# Patient Record
Sex: Female | Born: 1965 | Race: Black or African American | Hispanic: No | Smoking: Never smoker
Health system: Southern US, Community
[De-identification: ages and names within clinical notes are randomized; demographics above are authoritative.]

## PROBLEM LIST (undated history)

## (undated) DIAGNOSIS — I10 Essential (primary) hypertension: Secondary | ICD-10-CM

## (undated) DIAGNOSIS — R768 Other specified abnormal immunological findings in serum: Secondary | ICD-10-CM

## (undated) DIAGNOSIS — K759 Inflammatory liver disease, unspecified: Secondary | ICD-10-CM

## (undated) HISTORY — PX: TUBAL LIGATION: SHX77

## (undated) HISTORY — DX: Inflammatory liver disease, unspecified: K75.9

## (undated) HISTORY — DX: Other specified abnormal immunological findings in serum: R76.8

## (undated) HISTORY — DX: Essential (primary) hypertension: I10

---

## 1997-10-22 ENCOUNTER — Emergency Department (HOSPITAL_COMMUNITY): Admission: EM | Admit: 1997-10-22 | Discharge: 1997-10-22 | Payer: Self-pay | Admitting: Emergency Medicine

## 2003-01-24 ENCOUNTER — Encounter: Payer: Self-pay | Admitting: Obstetrics and Gynecology

## 2003-01-24 ENCOUNTER — Ambulatory Visit (HOSPITAL_COMMUNITY): Admission: RE | Admit: 2003-01-24 | Discharge: 2003-01-24 | Payer: Self-pay | Admitting: Obstetrics and Gynecology

## 2003-04-23 DIAGNOSIS — K759 Inflammatory liver disease, unspecified: Secondary | ICD-10-CM

## 2003-04-23 HISTORY — DX: Inflammatory liver disease, unspecified: K75.9

## 2004-02-09 ENCOUNTER — Ambulatory Visit (HOSPITAL_COMMUNITY): Admission: RE | Admit: 2004-02-09 | Discharge: 2004-02-09 | Payer: Self-pay

## 2004-03-12 ENCOUNTER — Emergency Department (HOSPITAL_COMMUNITY): Admission: EM | Admit: 2004-03-12 | Discharge: 2004-03-12 | Payer: Self-pay | Admitting: Emergency Medicine

## 2004-05-24 ENCOUNTER — Ambulatory Visit: Payer: Self-pay | Admitting: Internal Medicine

## 2004-09-26 ENCOUNTER — Ambulatory Visit: Payer: Self-pay | Admitting: Family Medicine

## 2004-10-01 ENCOUNTER — Encounter: Admission: RE | Admit: 2004-10-01 | Discharge: 2004-10-01 | Payer: Self-pay | Admitting: Family Medicine

## 2004-10-29 ENCOUNTER — Ambulatory Visit: Payer: Self-pay | Admitting: Family Medicine

## 2005-10-14 ENCOUNTER — Ambulatory Visit: Payer: Self-pay | Admitting: Internal Medicine

## 2005-10-14 ENCOUNTER — Other Ambulatory Visit: Admission: RE | Admit: 2005-10-14 | Discharge: 2005-10-14 | Payer: Self-pay | Admitting: Internal Medicine

## 2006-10-03 ENCOUNTER — Inpatient Hospital Stay (HOSPITAL_COMMUNITY): Admission: EM | Admit: 2006-10-03 | Discharge: 2006-10-04 | Payer: Self-pay | Admitting: Emergency Medicine

## 2006-10-07 ENCOUNTER — Encounter (HOSPITAL_COMMUNITY): Admission: RE | Admit: 2006-10-07 | Discharge: 2006-11-06 | Payer: Self-pay | Admitting: General Surgery

## 2008-03-02 ENCOUNTER — Emergency Department (HOSPITAL_COMMUNITY): Admission: EM | Admit: 2008-03-02 | Discharge: 2008-03-02 | Payer: Self-pay | Admitting: Emergency Medicine

## 2009-03-13 ENCOUNTER — Other Ambulatory Visit: Admission: RE | Admit: 2009-03-13 | Discharge: 2009-03-13 | Payer: Self-pay | Admitting: Obstetrics and Gynecology

## 2009-03-17 ENCOUNTER — Ambulatory Visit (HOSPITAL_COMMUNITY): Admission: RE | Admit: 2009-03-17 | Discharge: 2009-03-17 | Payer: Self-pay | Admitting: Obstetrics & Gynecology

## 2009-05-24 ENCOUNTER — Emergency Department (HOSPITAL_COMMUNITY): Admission: EM | Admit: 2009-05-24 | Discharge: 2009-05-25 | Payer: Self-pay | Admitting: Emergency Medicine

## 2009-06-11 ENCOUNTER — Emergency Department (HOSPITAL_COMMUNITY): Admission: EM | Admit: 2009-06-11 | Discharge: 2009-06-11 | Payer: Self-pay | Admitting: Emergency Medicine

## 2009-06-15 ENCOUNTER — Emergency Department (HOSPITAL_COMMUNITY): Admission: EM | Admit: 2009-06-15 | Discharge: 2009-06-16 | Payer: Self-pay | Admitting: Emergency Medicine

## 2009-06-16 ENCOUNTER — Ambulatory Visit (HOSPITAL_COMMUNITY): Admission: RE | Admit: 2009-06-16 | Discharge: 2009-06-16 | Payer: Self-pay | Admitting: Emergency Medicine

## 2009-06-21 ENCOUNTER — Ambulatory Visit (HOSPITAL_COMMUNITY): Admission: RE | Admit: 2009-06-21 | Discharge: 2009-06-21 | Payer: Self-pay | Admitting: Obstetrics & Gynecology

## 2009-06-28 DIAGNOSIS — L299 Pruritus, unspecified: Secondary | ICD-10-CM | POA: Insufficient documentation

## 2009-06-28 DIAGNOSIS — Z9189 Other specified personal risk factors, not elsewhere classified: Secondary | ICD-10-CM | POA: Insufficient documentation

## 2009-06-28 DIAGNOSIS — Z8619 Personal history of other infectious and parasitic diseases: Secondary | ICD-10-CM | POA: Insufficient documentation

## 2009-06-28 DIAGNOSIS — R5383 Other fatigue: Secondary | ICD-10-CM

## 2009-06-28 DIAGNOSIS — R5381 Other malaise: Secondary | ICD-10-CM | POA: Insufficient documentation

## 2009-06-29 ENCOUNTER — Ambulatory Visit: Payer: Self-pay | Admitting: Internal Medicine

## 2009-06-29 DIAGNOSIS — R1084 Generalized abdominal pain: Secondary | ICD-10-CM | POA: Insufficient documentation

## 2009-06-29 DIAGNOSIS — R7401 Elevation of levels of liver transaminase levels: Secondary | ICD-10-CM | POA: Insufficient documentation

## 2009-06-29 DIAGNOSIS — B182 Chronic viral hepatitis C: Secondary | ICD-10-CM

## 2009-06-29 DIAGNOSIS — R740 Nonspecific elevation of levels of transaminase and lactic acid dehydrogenase [LDH]: Secondary | ICD-10-CM

## 2009-06-30 ENCOUNTER — Encounter: Payer: Self-pay | Admitting: Gastroenterology

## 2009-06-30 ENCOUNTER — Encounter: Payer: Self-pay | Admitting: Internal Medicine

## 2009-07-07 LAB — CONVERTED CEMR LAB
ANA Titer 1: 1:40 {titer} — ABNORMAL HIGH
HCV Quantitative: 315000 intl units/mL — ABNORMAL HIGH (ref <43)
Prothrombin Time: 15.8 s — ABNORMAL HIGH (ref 11.6–15.2)

## 2009-07-13 ENCOUNTER — Encounter: Payer: Self-pay | Admitting: Internal Medicine

## 2009-07-13 LAB — CONVERTED CEMR LAB
Ferritin: 535 ng/mL — ABNORMAL HIGH (ref 10–291)
IgG (Immunoglobin G), Serum: 2030 mg/dL — ABNORMAL HIGH (ref 694–1618)
Sed Rate: 18 mm/hr (ref 0–22)

## 2009-07-21 HISTORY — PX: LIVER BIOPSY: SHX301

## 2009-08-08 ENCOUNTER — Encounter: Payer: Self-pay | Admitting: Gastroenterology

## 2009-08-11 ENCOUNTER — Ambulatory Visit (HOSPITAL_COMMUNITY)
Admission: RE | Admit: 2009-08-11 | Discharge: 2009-08-11 | Payer: Self-pay | Source: Home / Self Care | Admitting: Internal Medicine

## 2009-08-16 ENCOUNTER — Encounter (INDEPENDENT_AMBULATORY_CARE_PROVIDER_SITE_OTHER): Payer: Self-pay

## 2009-08-18 ENCOUNTER — Encounter: Payer: Self-pay | Admitting: Internal Medicine

## 2009-08-23 ENCOUNTER — Telehealth (INDEPENDENT_AMBULATORY_CARE_PROVIDER_SITE_OTHER): Payer: Self-pay

## 2009-08-24 ENCOUNTER — Encounter: Payer: Self-pay | Admitting: Gastroenterology

## 2009-08-29 ENCOUNTER — Encounter: Payer: Self-pay | Admitting: Internal Medicine

## 2009-11-02 ENCOUNTER — Ambulatory Visit: Payer: Self-pay | Admitting: Gastroenterology

## 2009-11-17 ENCOUNTER — Encounter (INDEPENDENT_AMBULATORY_CARE_PROVIDER_SITE_OTHER): Payer: Self-pay

## 2009-12-12 ENCOUNTER — Encounter (INDEPENDENT_AMBULATORY_CARE_PROVIDER_SITE_OTHER): Payer: Self-pay | Admitting: *Deleted

## 2010-01-15 ENCOUNTER — Encounter: Payer: Self-pay | Admitting: Internal Medicine

## 2010-03-27 ENCOUNTER — Encounter: Payer: Self-pay | Admitting: Internal Medicine

## 2010-04-27 ENCOUNTER — Ambulatory Visit (HOSPITAL_COMMUNITY): Admission: RE | Admit: 2010-04-27 | Payer: Self-pay | Source: Home / Self Care | Admitting: Obstetrics & Gynecology

## 2010-05-13 ENCOUNTER — Encounter: Payer: Self-pay | Admitting: Obstetrics & Gynecology

## 2010-05-14 ENCOUNTER — Encounter: Payer: Self-pay | Admitting: Family Medicine

## 2010-05-22 NOTE — Miscellaneous (Signed)
Summary: path report  Clinical Lists Changes SP-Surgical Pathology - STATUS: Final  .                                         Perform Date: 22Apr11 00:01  Ordered By: DEFAULT MD , PROVIDER         Ordered Date:  Facility: [facility]                              Department: [facility]  Service Report Text  [facility]   [address]   [place], [place] [address]   Telephone [phone] or [phone] Fax [phone]    REPORT OF SURGICAL PATHOLOGY    Case #: [id number]   Patient Name: [name], [name]   Office Chart Number: [id number]    MRN: [id number]   Pathologist: Laureen Ochs M.D., Jessica Priest   DOB/Age [birth year] (Age: 45) Gender: F   Date Taken: 08/11/2009   Date Received: 08/11/2009    FINAL DIAGNOSIS   ***Microscopic Examination and Diagnosis***    1. 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 Liver, needle/core biopsy,    SPECIMEN COMMENTS:   1. Hepatitis c (tc)    Gross Description   1. Received in formalin are two cores of tan red soft tissue, each 1.5 x 0.1 cm   which are submitted in toto in one block. (Sw:Mw 08-11-09)    MICROSCOPIC DESCRIPTION   1. Reticulin and trichrome stains confirm the presence of portal fibrosis. An   iron stain shows slight increase in iron mostly in kupffer cells consistent with   secondary change. Clinical correlation is recommended. Dr. Luisa Hart reviewed   this case and concurs. (Bns:Kv 09-13-09)    CASE COMMENTS   STAINS USED IN DIAGNOSIS:   Iron stain   Masson's trichrome stain   Reticulin stain   Additional Information  HL7 RESULT STATUS : F  External IF Update Timestamp : 2009-08-14:14:21:00.000000  Appended Document: path  report path report noted; i really need to speak w Dr. Luisa Hart; I called him; he's out today - he is to call  me in am and I'll get back w pt - please let her know  Appended Document: path report Discused with Dr. Laureen Ochs; case re-reviewed with Dr. Luisa Hart - finding most consistent w HCV although Autoimmune not totally excluded histologically.  I recommend this lady be referred to the specialty clinic in [facility] for further evaluation/mgt  Appended Document: path report Please refer her to specialty clinic in [place] for further management of HCV and ?Autoimmune hepatitis. Send copy of all labs in EMR. Send copy of liver biopsy results. Send my consult note.  She still needs f/u here in next couple of months to arrange for TCS. Sooner f/u if she still has abd pain.  Appended Document: path report pt aware, she can be reached at daughters number- [phone]  Appended Document: path report Referral faxed.  Appended Document: path report reminder in computer

## 2010-05-22 NOTE — Progress Notes (Signed)
Summary: phone note  Phone Note Call from Patient   Caller: Patient Summary of Call: Pt said she recently had a liver biopsy, and she is requesting something from [facility] for hot flashes. Victorino Dike is declining at the present until she hears form our office to find out if it is safe to do. Please advise. Initial call taken by: Cloria Spring LPN,  Aug 23, 1608 9:50 AM     Appended Document: phone note Letter done for [facility]. Discussed with RMR. No reason not to treat hot flashes.  Patient needs OV here in two months to schedule TCS.  Appended Document: phone note Reminder already placed in IDX for f/u.

## 2010-05-22 NOTE — Assessment & Plan Note (Signed)
Summary: HX OF HEPATITIS,ELEVATED LIVER ENZYMES   Visit Type:  Initial Consult Referring Provider:  Cyril Mourning Primary Care Provider:  Cyril Mourning  Chief Complaint:  Hx hepatitis/elevated liver enzymes.  History of Present Illness: [name] is a pleasant 45 y/o female, who presents at request of Cyril Mourning, NP, for further evaluation of elevated lfts, h/o hepatitis. [name] has h/o icteric hepatitis back in 2005. Transaminases were in the thousands, bili over 10 at the time. W/U included negative hepatitis B and hepatitis A markers, but her HCV Ab was positive. Her AMA was negative. HCVRNA load 1960 International Units/ml. At the time, Dr. Karilyn Cota was not convinced that she had acute HCV but unfortunately patient was lost to follow-up.   She now c/o abdominal pain which started couple of months ago. Pain has been intermittent. Initially it was mostly in right lower abdomin. Pain worse with movement. She was seen in ED (under two different MRNs) on 05/24/09, 06/11/09, 06/16/09. Initial ED visit, there was consideration of early appendicitis, but she tells me she did not have surgery due to varying opinions about the diagnosis. See below for details of imaging studies. She now c/o epigastric pain worse with spicy food. No n/v, heartburn, dysphagia. She denies dysuria. No menstrual cycle in over one year. Multiple urine pregnancy test negative during ED visits. BM regular without melena, brbpr.  CT A/P with CM, 05/25/09 --> Minimally prominent appendix. Minimal thickening of lateral coronal fascia. Tiny RLQ LNs. ?early appendicitis? Adenopathy porta hepatitis, gastrohepatic ligament, peripancreatic region and periaortic region. ?may benefit from MR.   CT A/P with CM, 06/16/09 --> Liver normal. Mildly enlarged retroperitoneal lymph nodes, ?reactive. Consider f/u CT in 6 months. No evidence of appendicitis.  Abd U/S, 03/17/09 --> nodular structure in the porta hepatis ? prominent LN vs  hepatic lesion.   Pelvic U/S done in ED 06/11/09 --> uterine leiomyoma? f/u 6 months  Labs in 2/11 --> AST 152, ALT 277. Acute hepatitis panel negative except HCV Ab.  LFTs in 11/10 --> AST 117, ALT 258          Current Medications (verified): 1)  Ketorolac Tromethamine 10 Mg Tabs (Ketorolac Tromethamine) .... As Needed 2)  Zofran 8 Mg Tabs (Ondansetron Hcl) .... Prn 3)  Ultracet 37.5-325 Mg Tabs (Tramadol-Acetaminophen) .... 2 Qid Prn  Allergies (verified): No Known Drug Allergies  Past History:  Past Medical History: Icteric hepatitis, 2005.  HCV RNA, 1960 International Units/mL in 2005. Patient lost to follow-up.  Past Surgical History: Tubal Ligation  Family History: Mother, deceased age 77, accidental GSW Father, deceased age 20, CVA No FH of CRC, liver disease, chronic GI illnesses.  Social History: Separated for years. Never smoked. No alcohol use. No drug use. Couple tatoos. No prior blood transfusion.   Review of Systems General:  Complains of weight loss; denies fever, chills, sweats, anorexia, fatigue, and weakness; 5 pound weight loss. Eyes:  Denies vision loss. ENT:  Denies nasal congestion, sore throat, hoarseness, and difficulty swallowing. CV:  Denies chest pains, angina, palpitations, dyspnea on exertion, and peripheral edema. Resp:  Denies dyspnea at rest, dyspnea with exercise, and cough. GI:  See HPI. GU:  Complains of amenorrhea; denies urinary burning and blood in urine. MS:  Denies joint pain / LOM. Derm:  Denies rash and itching. Neuro:  Denies weakness, paralysis, frequent headaches, memory loss, and confusion. Psych:  Denies depression and anxiety. Endo:  Complains of unusual weight change. Heme:  Denies bruising and bleeding. Allergy:  Denies  hives and rash.  Vital Signs:  Patient profile:   45 year old female Height:      63 inches Weight:      173 pounds BMI:     30.76 Temp:     97.9 degrees F oral Pulse rate:   80 / minute BP  sitting:   120 / 70  (left arm) Cuff size:   regular  Vitals Entered By: Kristin Spring LPN (June 29, 2009 2:31 PM)  Physical Exam  General:  Well developed, well nourished, no acute distress. Head:  Normocephalic and atraumatic. Eyes:  Conjunctivae pink, no scleral icterus.  Mouth:  Oropharyngeal mucosa moist, pink.  No lesions, erythema or exudate.    Neck:  Supple; no masses or thyromegaly. Lungs:  Clear throughout to auscultation. Heart:  Regular rate and rhythm; no murmurs, rubs,  or bruits. Abdomen:  normal bowel sounds, obese, suprapubic tenderness, epigastric tenderness, without guarding, and without rebound.  normal bowel sounds, obese, suprapubic tendernessRUQ tenderness, epigastric tenderness, without guarding, without rebound, no hernia, no distesion, no masses, and no hepatomegally or splenomegaly.   Extremities:  No clubbing, cyanosis, edema or deformities noted. Neurologic:  Alert and  oriented x4;  grossly normal neurologically. Skin:  Intact without significant lesions or rashes. Cervical Nodes:  No significant cervical adenopathy. Psych:  Alert and cooperative. Normal mood and affect.  Impression & Recommendations:  Problem # 1:  TRANSAMINASES, SERUM, ELEVATED (ICD-790.4) H/O icteric hepatitis and HCV RNA positive titer. Needs f/u HCV RNA, ANA. If titer still positive, then referral to Hepatitis Clinic for treatment. Discussed potential treatments, length of therapy and necessary committment on her part. Discussed HCV and natural history, modes of transmission. Orders: T-PT (Prothrombin Time) ([address]) T-ANA ([id number]) Consultation Level IV (91478)  Problem # 2:  ABDOMINAL PAIN, GENERALIZED (ICD-789.07)  Initially more generalized abdominal pain and right sided abdominal pain. Symptoms some better. ?mesenteric adenitis given lymphadenopathy and improvement. Doubt symptoms secondary to uterine leiomyoma. Will continue to monitor closely. Will review films with  radiologist.  Orders: Consultation Level IV ([address])  Problem # 3:  ABDOMINAL PAIN, EPIGASTRIC (ICD-789.06) Pain more in epigastrium now. Worse with spicy foods. Will add Dexilant 60mg  by mouth daily, #20 samples provided. If symptoms don't improve she will need EGD +/- HIDA. Orders: T-ANA ([id number]) Consultation Level IV ([address])  Problem # 4:  SCREENING COLORECTAL-CANCER (ICD-V76.51)  She needs TCS sometime this year. Patient aware. Will plan after current symptoms under control.  Orders: Consultation Level IV [id number])  Other Orders: T-Hepatitis C RNA Quant PCR ([id number])     Appended Document: HX OF HEPATITIS,ELEVATED LIVER ENZYMES Reviewed CTs with Dr. Tyron Russell. Both CTs show intraabdominal lymphadenopathy as outlined. Periportal, porta hepatitis, gastrohepatic ligament LNs could be explained by h/o hepatitis C. ?source for periaortic region LN. No evidence of appendicitis on second CT. No liver lesions seen.  CT A/P with IV/oral contrast in 8/11 to f/u on intraabdominal lymphadenopathy.  Appended Document: HX OF HEPATITIS,ELEVATED LIVER ENZYMES LMOM to call.  Appended Document: HX OF HEPATITIS,ELEVATED LIVER ENZYMES Pt informed.

## 2010-05-22 NOTE — Letter (Signed)
Summary: Recall Radiology  [facility]  [address]   [place], [place] [address]   Phone: [phone]  Fax: [phone]    December 12, 2009  [name] [address] [place], [place]  [address] 1966-04-13   Dear Ms. Guillot,   Our office needs to get you scheduled for your CT Scan. Please give our office a call to schedule this.  You may call the office at your convenience at [phone].  Please ask for the Referral Coordinator to make arrangements for this to be scheduled.  You may have to leave a message on our voice mail.  We will return your call.  If for any reason you do not wish to schedule this, please advise the office.  Please do not neglect your health.   Thank you,    Ave Filter  [facility] Ph: [phone]   Fax: [phone]

## 2010-05-22 NOTE — Letter (Signed)
Summary: Letter to [facility]  [address]   [place], [place] [address]   Phone: [phone]  Fax: [phone]      Aug 24, 2009             RE: [name]   [birth year]                 [address]                 [place], [place]  [address]  Dear Victorino Dike,  I wanted to touch base with you regarding [name]. As you recall, you referred her for h/o abnormal LFTs and h/o hepatitis. [name] had h/o icteric hepatitis in 2005. At that time, her HCV RNA titer was positive but very low. Unfortunately she was lost to follow-up. Recently we rechecked her HCV RNA titer and it is 315,000 IU. She also had positive ANA and elevated IgG and IgA levels, concerning for Autoimmune Hepatitis. CT A/P showed mildly prominent retroperitoneal lymph nodes (possibly reactive from HCV). She will have f/u CT in 8/11. She had liver biopsy which showed chronic hepatitis mildly active (grade II) with portal fibrosis (stage I), most c/w HCV. Autoimmune Hepatitis could not be excluded. She has been referred to [facility] in [place] for further management.  We plan to see [name] back in August to f/u CT and schedule colonoscopy. [name] called stating that she desired treatment for hot flashes. There are no contraindications for that sort of treatment based on her current liver disease. If you have any questions or concerns, don't hesitate to call.  Sincerely,    Leanna Battles. [facility] Ph: [phone]   Fax: [phone]

## 2010-05-22 NOTE — Letter (Signed)
Summary: [facility] APPT CONFIRMATION  [facility]PT CONFIRMATION   Imported By: Ave Filter 08/29/2009 09:22:30  _____________________________________________________________________  External Attachment:    Type:   Image     Comment:   External Document

## 2010-05-22 NOTE — Letter (Signed)
Summary: Internal Other/fax to [facility]  Internal Other/fax to [facility]   Imported By: Cloria Spring LPN 09/81/1914 78:29:56  _____________________________________________________________________  External Attachment:    Type:   Image     Comment:   External Document

## 2010-05-22 NOTE — Letter (Signed)
Summary: [facility] CONFIRMATION REFERRAL  [facility] CONFIRMATION REFERRAL   Imported By: Ave Filter 08/18/2009 14:22:28  _____________________________________________________________________  External Attachment:    Type:   Image     Comment:   External Document

## 2010-05-22 NOTE — Letter (Signed)
Summary: REFERRAL/[facility]  REFERRAL/[facility]   Imported By: Diana Eves 06/30/2009 14:58:56  _____________________________________________________________________  External Attachment:    Type:   Image     Comment:   External Document

## 2010-05-22 NOTE — Letter (Signed)
Summary: Recall Office Visit  [facility]  [address]   [place], [place] [address]   Phone: [phone]  Fax: [phone]      November 17, 2009   [name] [address] [place], [place]  [address] [birth year]   Dear Ms. Witts,   According to our records, it is time for you to schedule a follow-up office visit with [place].   At your convenience, please call [phone] to schedule an office visit. If you have any questions, concerns, or feel that this letter is in error, we would appreciate your call.   Sincerely,    Hendricks Limes LPN  [facility] Ph: [phone]   Fax: [phone]

## 2010-05-22 NOTE — Letter (Signed)
Summary: CT LIVER BX ORDER  CT LIVER BX ORDER   Imported By: Ave Filter 07/13/2009 09:44:35  _____________________________________________________________________  External Attachment:    Type:   Image     Comment:   External Document  Appended Document: CT LIVER BX ORDER PT CALLED TO LET us KNOW SHE CHANGED HER APPT TO 08/11/09.

## 2010-05-22 NOTE — Letter (Signed)
Summary: [facility] C REFERRAL  [facility] C REFERRAL   Imported By: Ave Filter 08/18/2009 08:41:57  _____________________________________________________________________  External Attachment:    Type:   Image     Comment:   External Document

## 2010-05-22 NOTE — Letter (Signed)
Summary: [facility]RS NOTES FROM [facility]   DRS NOTES FROM [facility]   Imported By: Rexene Alberts 01/15/2010 12:23:17  _____________________________________________________________________  External Attachment:    Type:   Image     Comment:   External Document

## 2010-05-23 ENCOUNTER — Encounter: Payer: Self-pay | Admitting: Obstetrics & Gynecology

## 2010-05-24 NOTE — Letter (Signed)
Summary: RECORDS FROM [facility] FROM [facility]   Imported By: Rexene Alberts 03/27/2010 15:30:12  _____________________________________________________________________  External Attachment:    Type:   Image     Comment:   External Document

## 2010-07-10 LAB — CBC
MCV: 91.8 fL (ref 78.0–100.0)
WBC: 4.7 10*3/uL (ref 4.0–10.5)

## 2010-07-10 LAB — PROTIME-INR: INR: 1.16 (ref 0.00–1.49)

## 2010-07-10 LAB — APTT: aPTT: 32 seconds (ref 24–37)

## 2010-07-11 LAB — URINALYSIS, ROUTINE W REFLEX MICROSCOPIC
Glucose, UA: NEGATIVE mg/dL
Leukocytes, UA: NEGATIVE
Protein, ur: NEGATIVE mg/dL
Specific Gravity, Urine: >1.03 — ABNORMAL HIGH (ref 1.005–1.030)
Urobilinogen, UA: 1 mg/dL (ref 0.0–1.0)

## 2010-07-11 LAB — COMPREHENSIVE METABOLIC PANEL
BUN: 12 mg/dL (ref 6–23)
Chloride: 102 mEq/L (ref 96–112)
Creatinine, Ser: 0.87 mg/dL (ref 0.4–1.2)
GFR calc Af Amer: >60 mL/min (ref >60)
Glucose, Bld: 119 mg/dL — ABNORMAL HIGH (ref 70–99)
Potassium: 3.8 mEq/L (ref 3.5–5.1)
Sodium: 134 mEq/L — ABNORMAL LOW (ref 135–145)
Total Bilirubin: 0.7 mg/dL (ref 0.3–1.2)

## 2010-07-11 LAB — HEPATITIS PANEL, ACUTE
Hep A IgM: NEGATIVE
Hep B C IgM: NEGATIVE
Hepatitis B Surface Ag: NEGATIVE

## 2010-07-11 LAB — CBC
HCT: 35.7 % — ABNORMAL LOW (ref 36.0–46.0)
Hemoglobin: 12.2 g/dL (ref 12.0–15.0)
MCV: 90.7 fL (ref 78.0–100.0)
Platelets: 145 10*3/uL — ABNORMAL LOW (ref 150–400)
RBC: 4.42 MIL/uL (ref 3.87–5.11)
WBC: 11.6 10*3/uL — ABNORMAL HIGH (ref 4.0–10.5)
WBC: 9.3 10*3/uL (ref 4.0–10.5)

## 2010-07-11 LAB — URINE MICROSCOPIC-ADD ON

## 2010-07-11 LAB — DIFFERENTIAL
Eosinophils Relative: 0 % (ref 0–5)
Lymphocytes Relative: 12 % (ref 12–46)
Lymphocytes Relative: 33 % (ref 12–46)
Lymphs Abs: 1.4 10*3/uL (ref 0.7–4.0)
Monocytes Absolute: 0.6 10*3/uL (ref 0.1–1.0)
Monocytes Relative: 7 % (ref 3–12)
Neutro Abs: 5.5 10*3/uL (ref 1.7–7.7)
Neutrophils Relative %: 82 % — ABNORMAL HIGH (ref 43–77)

## 2010-07-12 LAB — BASIC METABOLIC PANEL
BUN: 16 mg/dL (ref 6–23)
CO2: 26 mEq/L (ref 19–32)
Chloride: 107 mEq/L (ref 96–112)
Creatinine, Ser: 0.88 mg/dL (ref 0.4–1.2)
Glucose, Bld: 110 mg/dL — ABNORMAL HIGH (ref 70–99)
Potassium: 3.8 mEq/L (ref 3.5–5.1)

## 2010-07-12 LAB — CBC
HCT: 38.5 % (ref 36.0–46.0)
MCHC: 33.8 g/dL (ref 30.0–36.0)
MCV: 90.6 fL (ref 78.0–100.0)
Platelets: 166 10*3/uL (ref 150–400)
RDW: 12.6 % (ref 11.5–15.5)
WBC: 7.4 10*3/uL (ref 4.0–10.5)

## 2010-07-12 LAB — PREGNANCY, URINE: Preg Test, Ur: NEGATIVE

## 2010-07-12 LAB — DIFFERENTIAL
Basophils Relative: 1 % (ref 0–1)
Eosinophils Absolute: 0.1 10*3/uL (ref 0.0–0.7)
Eosinophils Relative: 1 % (ref 0–5)
Lymphs Abs: 3.3 10*3/uL (ref 0.7–4.0)
Neutrophils Relative %: 47 % (ref 43–77)

## 2010-07-12 LAB — URINALYSIS, ROUTINE W REFLEX MICROSCOPIC
Bilirubin Urine: NEGATIVE
Ketones, ur: NEGATIVE mg/dL
Nitrite: NEGATIVE
Protein, ur: NEGATIVE mg/dL
pH: 6 (ref 5.0–8.0)

## 2010-07-13 LAB — URINALYSIS, ROUTINE W REFLEX MICROSCOPIC
Bilirubin Urine: NEGATIVE
Hgb urine dipstick: NEGATIVE
Ketones, ur: NEGATIVE mg/dL
Specific Gravity, Urine: 1.025 (ref 1.005–1.030)
Urobilinogen, UA: 1 mg/dL (ref 0.0–1.0)
pH: 5.5 (ref 5.0–8.0)

## 2010-07-13 LAB — POCT I-STAT, CHEM 8
Calcium, Ion: 1.19 mmol/L (ref 1.12–1.32)
Creatinine, Ser: 0.9 mg/dL (ref 0.4–1.2)
Glucose, Bld: 101 mg/dL — ABNORMAL HIGH (ref 70–99)
Hemoglobin: 13.6 g/dL (ref 12.0–15.0)
TCO2: 28 mmol/L (ref 0–100)

## 2010-07-13 LAB — CBC
Hemoglobin: 13.4 g/dL (ref 12.0–15.0)
MCHC: 34.7 g/dL (ref 30.0–36.0)
MCV: 91.7 fL (ref 78.0–100.0)
RBC: 4.2 MIL/uL (ref 3.87–5.11)
RDW: 12.9 % (ref 11.5–15.5)

## 2010-07-13 LAB — DIFFERENTIAL
Basophils Relative: 0 % (ref 0–1)
Eosinophils Absolute: 0.1 10*3/uL (ref 0.0–0.7)
Monocytes Absolute: 0.5 10*3/uL (ref 0.1–1.0)
Monocytes Relative: 10 % (ref 3–12)

## 2010-07-13 LAB — WET PREP, GENITAL

## 2010-09-04 NOTE — Consult Note (Signed)
[name], [name]          ACCOUNT NO.:  [id number]   MEDICAL RECORD NO.:  [id number]          PATIENT TYPE:  INP   LOCATION:  A326                          FACILITY:  [facility]   PHYSICIAN:  Barbaraann Barthel, M.D. DATE OF BIRTH:  April 18, 1966   DATE OF CONSULTATION:  10/04/2006  DATE OF DISCHARGE:                                 CONSULTATION   REASON FOR CONSULTATION:  Surgery was asked to see this 45 year old  black female who was admitted through the emergency room for right upper  quadrant pain.   HISTORY OF PRESENT MEDICAL ILLNESS:  The patient states that she has had  some right upper quadrant pain for the last 2-3 months this has been not  particularly associated with nausea or vomiting or any particularly  fatty food intolerance.  Pain has been noted in right lower quadrant and  sometimes it has been accompanied with looser stools.  She has had no  febrile episodes with this either.   PHYSICAL EXAMINATION:  GENERAL:  A Pleasant 45 year old black female in  no acute distress.  VITAL SIGNS:  temperature is 97.5, blood pressure 120/83, heart rate 60  per minute, respirations 18 per minute.  She is 5 feet 3 inches and  weighs 160 pounds.  HEENT:  Head is normocephalic.  EYES:  Extraocular movements are intact.  Pupils were round and react to light and accommodation.  There is no  conjunctive pallor or scleral injection.  Sclerae is a normal tincture.  The patient uses corrective lenses.  Nose and oral mucosa are moist.  NECK:  Supple and cylindrical without jugular vein distension,  thyromegaly or tracheal deviation.  No bruits are auscultated.  No  cervical adenopathy is appreciated.  CHEST:  Clear both anterior and posterior auscultation.  HEART:  Regular rhythm.  BREASTS:  Without masses and axilla are without masses.  ABDOMEN:  The patient's bowel sounds are normoactive.  The patient has  mild tenderness in the right upper quadrant without rebound.  No CV  tenderness.   No femoral or inguinal hernias are appreciated.  RECTAL:  Stools guaiac-negative.  The stool is formed in her rectal  ampulla.  EXTREMITIES:  Within normal limits.   REVIEW OF SYSTEMS:  GI system no past history of hepatitis.  No real  symptoms of nausea or vomiting or fatty food intolerance.  The patient  has right upper quadrant pain accompanied with loose stools at times,  and she states that when she rolls over some time she has even more pain  on the right upper quadrant.  No past history of inflammatory bowel  disease.  No past history of bright red rectal bleeding or black tarry  stools and no change of her bowel habits or unexplained weight loss.  She has had no colonoscopy.   Endocrine System no history of diabetes or thyroid disease.   Cardiovascular system no past history of hypertension, although  hypertension and hypercholesterolemia runs in her family.  She is a  nonsmoker, nondrinker.   Musculoskeletal System grossly within normal limits.  Neurological  System:  No history of seizures or migraines or grossly no  we will  localizing neurological symptoms.   GU system no history of dysuria or nephrolithiasis.   OB/GYN history last menstrual period was approximately a month ago.  She  is spotting now.  She is a gravida 2, para 2, abortus zero, cesarean  zero female with no family history of carcinoma of the breast.  Her  sister had a breast biopsy for benign procedure.   Skin and integument the patient has a dark nevus that I note on her  chest.  She states that she has had that essentially since birth and  this runs in her family as well.  I suggested the need for a biopsy on  this some time.   LABORATORY DATA:  The patient has a white count of 4.5 with an H&H of  11.8 and 33.8.  Normal differential.  Liver function studies:  She was  admitted with SGOT of 268 which is now down to 205, SGPT was 502 which  is down now to 392.  Bilirubin has remained normal, alkaline  phosphatase  126-95.  Electrolytes are within normal limits.   IMPRESSION:  Therefore, I suspected that [name] is having biliary  colic.  We will keep her on clear liquid diets, continue Rocephin, and  we will obtain a sonogram.  We have discussed the need for laparoscopic  cholecystectomy versus open cholecystectomy if needed in great detail.  Informed consent was obtained.      Barbaraann Barthel, M.D.  Electronically Signed     WB/MEDQ  D:  10/04/2006  T:  10/04/2006  Job:  [id number]   cc:   Emergency Room

## 2011-01-17 ENCOUNTER — Other Ambulatory Visit: Payer: Self-pay | Admitting: Adult Health

## 2011-01-17 ENCOUNTER — Other Ambulatory Visit (HOSPITAL_COMMUNITY)
Admission: RE | Admit: 2011-01-17 | Discharge: 2011-01-17 | Disposition: A | Discharge status: Home / Self Care | Payer: BC Managed Care – PPO | Source: Ambulatory Visit | Attending: Obstetrics and Gynecology | Admitting: Obstetrics and Gynecology

## 2011-01-17 DIAGNOSIS — Z01419 Encounter for gynecological examination (general) (routine) without abnormal findings: Secondary | ICD-10-CM | POA: Insufficient documentation

## 2011-01-17 DIAGNOSIS — Z113 Encounter for screening for infections with a predominantly sexual mode of transmission: Secondary | ICD-10-CM | POA: Insufficient documentation

## 2011-01-17 DIAGNOSIS — Z09 Encounter for follow-up examination after completed treatment for conditions other than malignant neoplasm: Secondary | ICD-10-CM

## 2011-01-22 ENCOUNTER — Ambulatory Visit: Payer: Self-pay | Admitting: Gastroenterology

## 2011-01-22 ENCOUNTER — Telehealth: Payer: Self-pay | Admitting: Gastroenterology

## 2011-01-22 NOTE — Telephone Encounter (Signed)
Patient has appt on 01/28/11.

## 2011-01-22 NOTE — Telephone Encounter (Signed)
Pt was a no show

## 2011-01-28 ENCOUNTER — Ambulatory Visit: Payer: Self-pay | Admitting: Gastroenterology

## 2011-01-30 ENCOUNTER — Ambulatory Visit: Payer: BC Managed Care – PPO | Admitting: Gastroenterology

## 2011-02-04 ENCOUNTER — Ambulatory Visit: Payer: BC Managed Care – PPO | Admitting: Gastroenterology

## 2011-02-05 ENCOUNTER — Encounter: Payer: Self-pay | Admitting: Gastroenterology

## 2011-02-05 ENCOUNTER — Ambulatory Visit (INDEPENDENT_AMBULATORY_CARE_PROVIDER_SITE_OTHER): Payer: BC Managed Care – PPO | Admitting: Gastroenterology

## 2011-02-05 VITALS — BP 127/83 | HR 66 | Temp 97.2°F | Ht 63.0 in | Wt 165.0 lb

## 2011-02-05 DIAGNOSIS — R591 Generalized enlarged lymph nodes: Secondary | ICD-10-CM

## 2011-02-05 DIAGNOSIS — Z1211 Encounter for screening for malignant neoplasm of colon: Secondary | ICD-10-CM

## 2011-02-05 DIAGNOSIS — R7402 Elevation of levels of lactic acid dehydrogenase (LDH): Secondary | ICD-10-CM

## 2011-02-05 DIAGNOSIS — R599 Enlarged lymph nodes, unspecified: Secondary | ICD-10-CM

## 2011-02-05 DIAGNOSIS — R7401 Elevation of levels of liver transaminase levels: Secondary | ICD-10-CM

## 2011-02-05 MED ORDER — PEG-KCL-NACL-NASULF-NA ASC-C 100 G PO SOLR: 1.0000 | Freq: Once | ORAL | Status: DC

## 2011-02-05 NOTE — Progress Notes (Signed)
Primary Care Physician:  Cyril Mourning Primary Gastroenterologist: Dr Jena Gauss   Chief Complaint  Patient presents with  . Colonoscopy    HPI:   Ms. [name] is a 45 year old female who was last seen by our office in March 2011. At that time, she was being evaluated for elevated liver enzymes, hx of hepatitis. 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 ANA +, titer weakly +. We referred her to [facility] for further management; however, she states "nothing was done there". I do not have records from the initial consult at that time. She also had some abnormal findings on a CT in Feb 2011 that are outlined below. She was supposed to have a follow-up CT but never did this either.  She reports constipation, but she manages this with fruit. Denies melena or hematochezia. Reports RUQ pain, intermittent, nagging, not worsened with eating or drinking. No precipitating or relieving factors. Denies fever or chills. Denies dysphagia or reflux. She is down 8 lbs from March 2011. Labs apparently done last week at [facility]. Denies loss of appetite, pruritis, jaundice.   CT Feb 2011; IMPRESSION: was supposed to have f/u CT but never completed.  No evidence of appendicitis.  Mildly prominent retroperitoneal lymph nodes. While these may be  reactive, I would recommend follow-up CT in 6 months to assure  stability.  Fibroid uterus.     Past Medical History  Diagnosis Date  . 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 Hepatitis C antibody test positive     HCV RNA load 1960    Past Surgical History  Procedure Date  . Tubal ligation   . Liver biopsy April 2011    Likely HCV, ?autoimmune hepatitis, positive ANA, weak positive ANA titer, referred to [facility], lost to follow-up    Current Outpatient Prescriptions  Medication Sig Dispense Refill  . ketorolac (TORADOL) 10 MG tablet Take 10 mg by mouth every 6 (six) hours as needed.        .  ondansetron (ZOFRAN) 8 MG tablet Take by mouth every 8 (eight) hours as needed.        . peg 3350 powder (MOVIPREP) 100 G SOLR Take 1 kit (100 g total) by mouth once. As directed Please purchase 1 Fleets enema to use with the prep  1 kit  0  . traMADol-acetaminophen (ULTRACET) 37.5-325 MG per tablet Take 1 tablet by mouth every 6 (six) hours as needed.          Allergies as of 02/05/2011  . (No Known Allergies)    Family History  Problem Relation Age of Onset  . Colon cancer Neg Hx     History   Social History  . Marital Status: Single    Spouse Name: N/A    Number of Children: N/A  . Years of Education: N/A   Occupational History  . [employer]   Social History Main Topics  . Smoking status: Never Smoker   . Smokeless tobacco: None  . Alcohol Use: Yes     socially  . Drug Use: No  . Sexually Active: None   Other Topics Concern  . None   Social History Narrative  . None    Review of Systems: Gen: Denies fever, chills, anorexia. Denies fatigue, weakness, weight loss.  CV: Denies chest pain, palpitations, syncope, peripheral edema, and claudication. Resp: Denies dyspnea at rest, cough, wheezing, coughing up blood, and pleurisy. GI: Denies  vomiting blood, jaundice, and fecal incontinence.   Denies dysphagia or odynophagia. Derm: Denies rash, itching, dry skin Psych: Denies depression, anxiety, memory loss, confusion. No homicidal or suicidal ideation.  Heme: Denies bruising, bleeding, and enlarged lymph nodes.  Physical Exam: BP 127/83  Pulse 66  Temp(Src) 97.2 F (36.2 C) (Temporal)  Ht 5\' 3"  (1.6 m)  Wt 165 lb (74.844 kg)  BMI 29.23 kg/m2  LMP 01/22/2011 General:   Alert and oriented. No distress noted. Pleasant and cooperative.  Head:  Normocephalic and atraumatic. Eyes:  Conjuctiva clear without scleral icterus. Mouth:  Oral mucosa pink and moist. Good dentition. No lesions. Neck:  Supple, without mass or thyromegaly. Heart:  S1, S2  present without murmurs, rubs, or gallops. Regular rate and rhythm. Abdomen:  +BS, soft, non-tender and non-distended. No rebound or guarding. No HSM or masses noted. Msk:  Symmetrical without gross deformities. Normal posture. Pulses:  2+ DP noted bilaterally Extremities:  Without edema. Neurologic:  Alert and  oriented x4;  grossly normal neurologically. Skin:  Intact without significant lesions or rashes. Cervical Nodes:  No significant cervical adenopathy. Psych:  Alert and cooperative. Normal mood and affect.

## 2011-02-05 NOTE — Patient Instructions (Signed)
Please complete CT scan. We will be calling you with these results.  We have also set you up for a routine screening colonoscopy with Dr. Jena Gauss in the near future.  I am requesting labs from [place] at [facility] as well as the last note from [place]. This will help determine any further labs that need to be done, as well as where to refer you in the near future for further management of liver issues.

## 2011-02-06 ENCOUNTER — Other Ambulatory Visit: Payer: Self-pay | Admitting: Adult Health

## 2011-02-06 ENCOUNTER — Encounter: Payer: Self-pay | Admitting: Gastroenterology

## 2011-02-06 ENCOUNTER — Ambulatory Visit (HOSPITAL_COMMUNITY)
Admission: RE | Admit: 2011-02-06 | Discharge: 2011-02-06 | Disposition: A | Discharge status: Home / Self Care | Payer: BC Managed Care – PPO | Source: Ambulatory Visit | Attending: Adult Health | Admitting: Adult Health

## 2011-02-06 DIAGNOSIS — R591 Generalized enlarged lymph nodes: Secondary | ICD-10-CM | POA: Insufficient documentation

## 2011-02-06 DIAGNOSIS — R599 Enlarged lymph nodes, unspecified: Secondary | ICD-10-CM | POA: Insufficient documentation

## 2011-02-06 DIAGNOSIS — Z09 Encounter for follow-up examination after completed treatment for conditions other than malignant neoplasm: Secondary | ICD-10-CM

## 2011-02-06 DIAGNOSIS — Z1231 Encounter for screening mammogram for malignant neoplasm of breast: Secondary | ICD-10-CM | POA: Insufficient documentation

## 2011-02-06 NOTE — Assessment & Plan Note (Addendum)
45 year old African-American female with hx of likely HCV, possible autoimmune hepatitis. She had been referred to [facility] as of April 2011; however, she reports that nothing was done there. I do not have the original note from her first visit in [place]. Somehow, she has been lost to follow-up and not completed CT scan as requested either. [facility], and we are requesting these. She does report intermittent RUQ pain, no precipitating or relieving factors. At this point, it is vital we obtain updated labs, updated CT (see adenopathy), and obtain the notes from [place]. We will need to refer her again once these are obtained.   ADDENDUM 10/24: Received labs from [facility] dated 01/17/2011. Only AST, ALT elevated at 117 and 226 respectively. HCV RNA Quant: 6,680,000, HCV RNA Log 6.82 Needs referral again to [facility] for treatment of chronic HCV, ?Autoimmune hepatitis  Addendum 11/5: Received notes from [facility], Dr. Jacqualine Mau, from last year. He believed only dealing with Hep C, possible overcall by pathologist. Was to have repeat ANA and immunoglobulins, genotyping. Was to f/u at [facility]. Pt has been referred again to [facility] for treatment as of 10/24 when referral and notes were faxed.   Also, pt needs Hep A and B vaccines if not already completed. Will route to nurse to contact pt.

## 2011-02-06 NOTE — Assessment & Plan Note (Signed)
Due for routine screening as per last note in March 2011. No lower GI symptoms other than occasional constipation. Proceed with TCS with Dr. Jena Gauss in near future. The R/B/A have been discussed in detail, and she states understanding.

## 2011-02-06 NOTE — Assessment & Plan Note (Signed)
Actually CT X 2 in Feb 2011 with intraabdominal lymphadenopathy, no liver lesion. Missed 6 month f/u CT scan for reassessment of this. We will facilitate this for her as well. CT abd/pelvis in near future.

## 2011-02-07 LAB — DIFFERENTIAL
Basophils Absolute: 0
Basophils Relative: 1
Eosinophils Absolute: 0
Lymphocytes Relative: 54 — ABNORMAL HIGH
Lymphs Abs: 2.4
Monocytes Absolute: 0.4
Monocytes Relative: 8
Neutro Abs: 1.7
Neutrophils Relative %: 54

## 2011-02-07 LAB — BASIC METABOLIC PANEL
BUN: 7
Calcium: 8.4
GFR calc non Af Amer: >60
Glucose, Bld: 98
Sodium: 141

## 2011-02-07 LAB — URINALYSIS, ROUTINE W REFLEX MICROSCOPIC
Bilirubin Urine: NEGATIVE
Hgb urine dipstick: NEGATIVE
Specific Gravity, Urine: >1.03 — ABNORMAL HIGH
Urobilinogen, UA: 1

## 2011-02-07 LAB — CBC
Hemoglobin: 11.8 — ABNORMAL LOW
MCHC: 35.3
MCV: 89.2
Platelets: 188
RBC: 4.06
RDW: 13.2
WBC: 4.5

## 2011-02-07 LAB — COMPREHENSIVE METABOLIC PANEL
AST: 268 — ABNORMAL HIGH
CO2: 27
Calcium: 9.2
Chloride: 108
Creatinine, Ser: 0.77
GFR calc Af Amer: >60
GFR calc non Af Amer: >60
Glucose, Bld: 102 — ABNORMAL HIGH
Total Bilirubin: 0.5

## 2011-02-07 LAB — HEPATIC FUNCTION PANEL
ALT: 392 — ABNORMAL HIGH
AST: 205 — ABNORMAL HIGH
Total Protein: 6

## 2011-02-07 LAB — WET PREP, GENITAL
Trich, Wet Prep: NONE SEEN
Yeast Wet Prep HPF POC: NONE SEEN

## 2011-02-07 LAB — GC/CHLAMYDIA PROBE AMP, GENITAL: Chlamydia, DNA Probe: NEGATIVE

## 2011-02-07 LAB — LIPASE, BLOOD: Lipase: 22

## 2011-02-07 NOTE — Progress Notes (Signed)
Cc to PCP 

## 2011-02-08 ENCOUNTER — Ambulatory Visit (HOSPITAL_COMMUNITY)
Admission: RE | Admit: 2011-02-08 | Discharge: 2011-02-08 | Disposition: A | Discharge status: Home / Self Care | Payer: BC Managed Care – PPO | Source: Ambulatory Visit | Attending: Gastroenterology | Admitting: Gastroenterology

## 2011-02-08 DIAGNOSIS — R591 Generalized enlarged lymph nodes: Secondary | ICD-10-CM

## 2011-02-08 DIAGNOSIS — R1011 Right upper quadrant pain: Secondary | ICD-10-CM | POA: Insufficient documentation

## 2011-02-08 DIAGNOSIS — R1031 Right lower quadrant pain: Secondary | ICD-10-CM | POA: Insufficient documentation

## 2011-02-08 DIAGNOSIS — R599 Enlarged lymph nodes, unspecified: Secondary | ICD-10-CM

## 2011-02-08 MED ORDER — IOHEXOL 300 MG/ML  SOLN
100.0000 mL | Freq: Once | INTRAMUSCULAR | Status: AC | PRN
  Administered 2011-02-08: 100 mL via INTRAVENOUS

## 2011-02-13 NOTE — Progress Notes (Signed)
Quick Note:  Good to see lymphadenopathy has resolved. Liver appears normal. Received labs from [facility]: AST 117 ALT 226 CBC nl  HCV RNA quant: 6,680,000 (HIGH) HCV RNA log: 6.82H  Please inform pt we are referring her back to [facility] for treatment of HCV, possible autoimmune hepatitis component per liver biopsy last year. She only went once per her statement; this is very important that she follows through with this referral.  ALSO, make sure not drinking alcohol.   ______

## 2011-02-13 NOTE — Progress Notes (Signed)
REFERRAL AND NOTES FAXED

## 2011-02-13 NOTE — Progress Notes (Signed)
Notes/referral faxed

## 2011-02-14 NOTE — Progress Notes (Signed)
Quick Note:  Pt aware, please send referral. ______

## 2011-02-18 ENCOUNTER — Other Ambulatory Visit: Payer: Self-pay | Admitting: Gastroenterology

## 2011-02-18 DIAGNOSIS — Z1211 Encounter for screening for malignant neoplasm of colon: Secondary | ICD-10-CM

## 2011-02-19 ENCOUNTER — Encounter (HOSPITAL_COMMUNITY): Admission: RE | Payer: Self-pay | Source: Ambulatory Visit

## 2011-02-19 SURGERY — COLONOSCOPY
Anesthesia: Moderate Sedation

## 2011-02-21 ENCOUNTER — Other Ambulatory Visit: Payer: Self-pay | Admitting: Gastroenterology

## 2011-02-21 ENCOUNTER — Ambulatory Visit (HOSPITAL_COMMUNITY)
Admission: RE | Admit: 2011-02-21 | Payer: BC Managed Care – PPO | Source: Ambulatory Visit | Admitting: Internal Medicine

## 2011-02-21 DIAGNOSIS — Z1211 Encounter for screening for malignant neoplasm of colon: Secondary | ICD-10-CM

## 2011-02-26 ENCOUNTER — Telehealth: Payer: Self-pay | Admitting: Gastroenterology

## 2011-02-26 NOTE — Telephone Encounter (Signed)
Pt with Hep C. Needs Hep A and B vaccinations if not already done so. Please find out from pt. Thanks!

## 2011-02-27 NOTE — Telephone Encounter (Signed)
pts phone number has been disconnected. I will send letter with this info.

## 2011-03-01 MED ORDER — SODIUM CHLORIDE 0.45 % IV SOLN: Freq: Once | INTRAVENOUS | Status: DC

## 2011-03-04 ENCOUNTER — Ambulatory Visit (HOSPITAL_COMMUNITY)
Admission: RE | Admit: 2011-03-04 | Payer: BC Managed Care – PPO | Source: Ambulatory Visit | Admitting: Internal Medicine

## 2011-03-04 ENCOUNTER — Encounter (HOSPITAL_COMMUNITY): Admission: RE | Payer: Self-pay | Source: Ambulatory Visit

## 2011-03-04 SURGERY — COLONOSCOPY
Anesthesia: Moderate Sedation

## 2011-03-21 ENCOUNTER — Ambulatory Visit (INDEPENDENT_AMBULATORY_CARE_PROVIDER_SITE_OTHER): Payer: BC Managed Care – PPO | Admitting: Gastroenterology

## 2011-03-21 ENCOUNTER — Other Ambulatory Visit: Payer: Self-pay | Admitting: Gastroenterology

## 2011-03-21 DIAGNOSIS — B182 Chronic viral hepatitis C: Secondary | ICD-10-CM

## 2011-03-21 LAB — CBC WITH DIFFERENTIAL/PLATELET
Basophils Absolute: 0 10*3/uL (ref 0.0–0.1)
Eosinophils Relative: 1 % (ref 0–5)
Lymphocytes Relative: 61 % — ABNORMAL HIGH (ref 12–46)
MCV: 90.7 fL (ref 78.0–100.0)
Neutro Abs: 1.5 10*3/uL — ABNORMAL LOW (ref 1.7–7.7)
Neutrophils Relative %: 31 % — ABNORMAL LOW (ref 43–77)
Platelets: 159 10*3/uL (ref 150–400)
RBC: 4.51 MIL/uL (ref 3.87–5.11)
RDW: 12.8 % (ref 11.5–15.5)
WBC: 5 10*3/uL (ref 4.0–10.5)

## 2011-03-21 LAB — HEPATIC FUNCTION PANEL
ALT: 162 U/L — ABNORMAL HIGH (ref 0–35)
AST: 115 U/L — ABNORMAL HIGH (ref 0–37)
Albumin: 4.3 g/dL (ref 3.5–5.2)
Total Protein: 8 g/dL (ref 6.0–8.3)

## 2011-03-22 LAB — ANA: Anti Nuclear Antibody(ANA): POSITIVE — AB

## 2011-03-22 LAB — ANTI-NUCLEAR AB-TITER (ANA TITER): ANA Titer 1: NEGATIVE

## 2011-03-22 LAB — MITOCHONDRIAL ANTIBODIES: Mitochondrial M2 Ab, IgG: 0.2 (ref <0.91)

## 2011-03-24 LAB — INTERLEUKIN 28B POLYMORPHISM GENOTYPE, RT-PCR

## 2011-03-28 NOTE — Progress Notes (Addendum)
NAME:  [name], [name]  MR#:  [id number]      DATE:  03/21/2011  DOB:  08-May-1965    cc: Consulting Physician:  Gerrit Halls, NP, C/O Arline Asp, MD, [facility], [address], [place], [place] [address], Fax [phone] Primary Care Physician:  Same. Referring Physician:  Cyril Mourning, NP, [facility], [address], [address], [place], [place] [address], Fax [phone]     [facility] MR# [id number]   REASON FOR VISIT:  Followup of genotype 1a HCV.   History:  The patient returns today unaccompanied. Her care was supposed to be transferred to [facility], but she did not show for an appointment on 03/27/2010. It may have been been difficult for her to attend [facility] because she had no transportation. She was supposed to be booked for 6 month follow up in clinic which would have been by March 2012, in [place] but for some reason, it has not been scheduled until now.  It will be recalled that there was some concern on the part of her referring physician, Dr. Kendell Bane, that there may be a component of autoimmune hepatitis mostly because of a positive ANA. I pointed out  however that when I first saw her on 11/02/2009, the ANA was insignificantly positive at 1:40 and the IgG was only 1.25 times the upper limits of normal with a negative smooth muscle antibody. I had her liver biopsy of 08/11/2009, showing grade 2 stage I disease reviewed at [facility].  The pathologist who reviewed this biopsy on 09/05/2009, suggested that the overall impression is that of chronic hepatitis most consistent with hepatitis C. The pathologist conceded that it could have been a minor component of autoimmune hepatitis but the predominant feature was that of hepatitis C. I suspect the local  pathologist was biased into suggesting an autoimmune hepatitis pattern on the biopsy based on, again, an insignificantly elevated ANA. The bottom line is that I think that would be possible to treat her  hepatitis C  without provoking a flare of an autoimmune hepatitis. At this point, though, the patient is asymptomatic from her hepatitis.   Past medical history:  She mentions unintentional weight loss and she has lost 15 pounds since 11/12/2009, although I would say that she still appears of normal weight and she has lost only 4.6 pounds since 01/17/2011. In  terms of testing that was listed on 01/17/2011 in her gynecologist's notes, she was supposed to see a Dr. Kendell Bane for a colonoscopy in October 2012. The patient reports that she was unable to schedule the  colonoscopy due to work difficulties. A mammogram was supposed to be performed on 02/06/2011, although I do not have the report on this. She reports it was done. She also had a gynecologic exam on  01/17/2011. Last imaging of the liver that I have documentation of was a CT scan on 02/08/2011, with contrast that showed it was unremarkable. The patient also complains of hot flashes. She was given  estrogens, which she stopped because she found it was not effective. Otherwise there is no change in her medical history.   Current medications:  None.   Allergies:  Denies.   HABITS:  Smoking, never. Alcohol, denies interval consumption.   REVIEW OF SYSTEMS:  All 10 systems reviewed today with the patient and they are negative other than which is mentioned above. CES-D was 12.   PHYSICAL EXAMINATION:   Constitutional:  Well appearing without significant peripheral wasting. Despite her complaints of weight loss. Vital signs:  Height 63 inches, weight 160 pounds. BMI 28.3, which is considered overweight,  blood pressure 140/87, pulse 63, temperature 97.7 Fahrenheit.  Ears, nose, mouth and throat:  Unremarkable oropharynx.  No thyromegaly or neck masses.  Chest:  Resonant to percussion.  Clear to auscultation.   Cardiovascular:  Heart sounds normal S1, S2 without murmurs or rubs.  There is no peripheral edema.  Abdominal:  Normal bowel sounds.  No masses or  tenderness.  I could not appreciate a liver edge or spleen  tip.  I could not appreciate any hernias.  Lymphatics:  No cervical or inguinal lymphadenopathy.  Central Nervous System:  No asterixis or focal neurologic findings.  Dermatologic:  Anicteric without palmar  erythema or spider angiomata.  Eyes:  Anicteric sclerae.  Pupils are equal and reactive to light.   LABORATORY STUDIES:  From 01/17/2011, her ALT was 226, AST 117, ALP 81, total bilirubin 0.7, albumin 4.0, globulins 3.4, creatinine 0.77. Viral load was 6,680,000 international units per mL.   ASSESSMENT:  The patient is a 45 year old woman with a history of genotype 1a HCV with a biopsy on 08/11/2009 showing grade 2 stage I disease most consistent with hepatitis C. There is no concern on my part of  autoimmune hepatitis for the reasons I mentioned above. I think the patient is a good candidate for treatment. Of course, liver tests will be watched on treatment.  If there are any changes then a biopsy could be done to look for any evidence of autoimmune hepatitis, but again I do not think she has autoimmune hepatitis.  Today, I discussed treatment with pegylated interferon and ribavirin and a protease inhibitor. I reviewed our treatment protocol for our clinic, the treatment duration and response rates. I discussed the  specific system, constitutional, and psychiatric side effects of therapy, as well. The patient is accepting of the possibility of being treated. I indicated I would take her insurance information, start the  process of prior authorization with her medications. I explained to her that once medications are available, they will be shipped to her  house and she is to contact us for an appointment for teaching. We also discussed the importance of getting her vaccinations done.   Plan:  1. Hepatitis A and B vaccination number 1 today. 2. Return in 1 month's time for second hepatitis B vaccine and 6 months' time for her second  hepatitis A and third hepatitis B. 3. Today I will draw her IL 28 b. 4. In addition, I will repeat her immunoglobulins and ANA smooth muscle antimitochondrial antibody in addition, the liver enzymes and CBC with differential. 5. The viral load can be done at the time of teaching for the baseline viral load. 6. I will submit an authorization request through [facility], [place] for medications. 7. As above she will return for teaching at the time that she receives her medications from the specialty pharmacy, selected by her insurer.            Brooke Dare, MD   ADDENDUM: IL28B CC.  All autoimmune markers not significant.  Prior authorization obtained.  Orders sent to [facility].  [id number] .[id number]  D:  Thu Nov 29 18:52:00 2012 ; T:  Thu Nov 29 20:46:00 2012  Job #:  [id number]

## 2011-04-18 ENCOUNTER — Ambulatory Visit (INDEPENDENT_AMBULATORY_CARE_PROVIDER_SITE_OTHER): Payer: BC Managed Care – PPO | Admitting: Gastroenterology

## 2011-04-18 DIAGNOSIS — B182 Chronic viral hepatitis C: Secondary | ICD-10-CM

## 2011-05-02 ENCOUNTER — Ambulatory Visit (INDEPENDENT_AMBULATORY_CARE_PROVIDER_SITE_OTHER): Payer: BC Managed Care – PPO | Admitting: Gastroenterology

## 2011-05-02 DIAGNOSIS — B182 Chronic viral hepatitis C: Secondary | ICD-10-CM

## 2011-05-06 IMAGING — CT CT ABD-PELV W/ CM
2 of 5 series · 16 of 46 positions shown, 18 images · IV contrast (Omnipaque 300)
Comparison: None.

CLINICAL DATA: Abdominal pain right upper quadrant.

CT ABDOMEN AND PELVIS WITH CONTRAST
TECHNIQUE: Multidetector CT imaging of the abdomen and pelvis was
performed following the standard protocol during bolus
administration of intravenous contrast.
Contrast: 100 ml [id number].

[Series 2: abd_pel_with 5.0 b40f · axial · 0.61mm/px · z∈[-463,-68]mm · 13 of 91 slices shown, 15 images]
[im 6/91  soft-tissue]
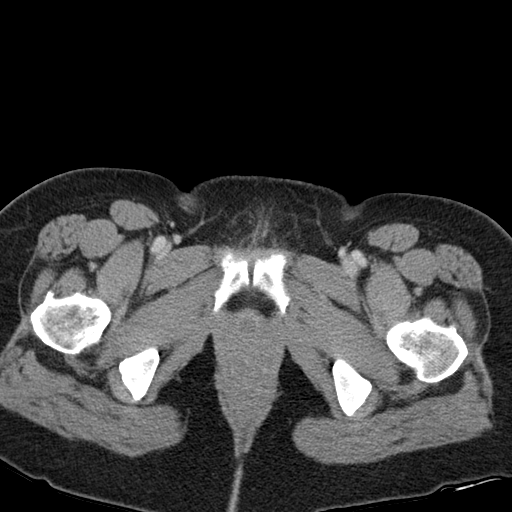
[im 6/91  bone]
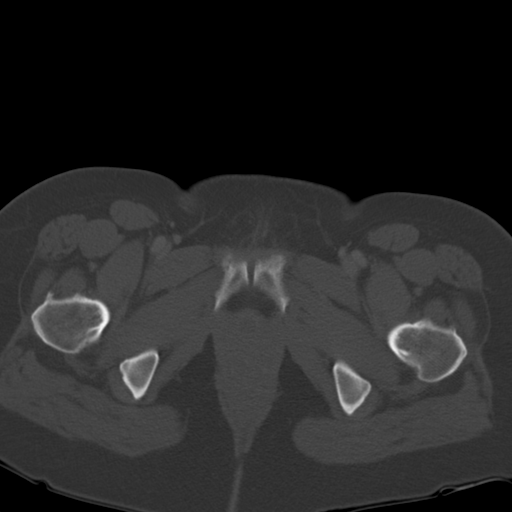
[im 11/91  soft-tissue]
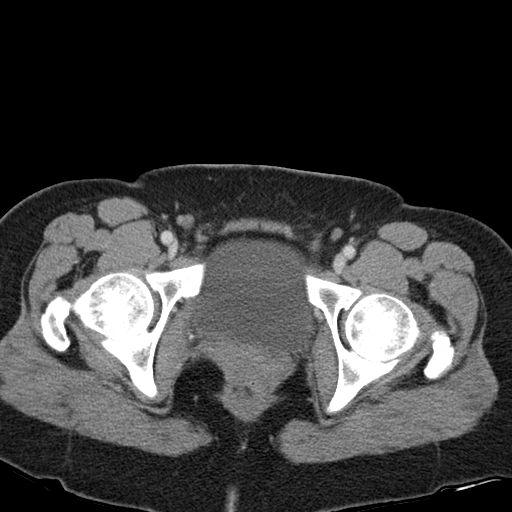
[im 22/91  soft-tissue]
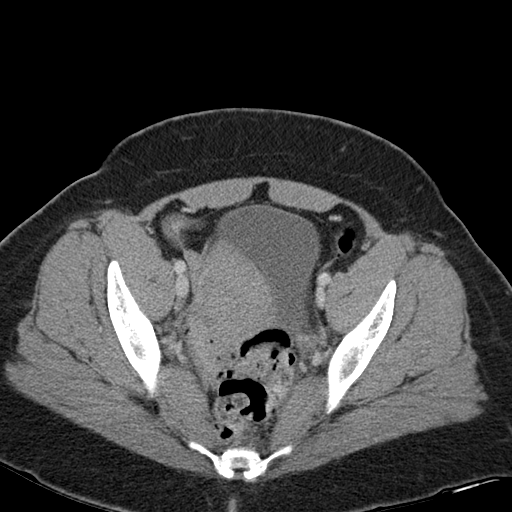
[im 27/91  soft-tissue]
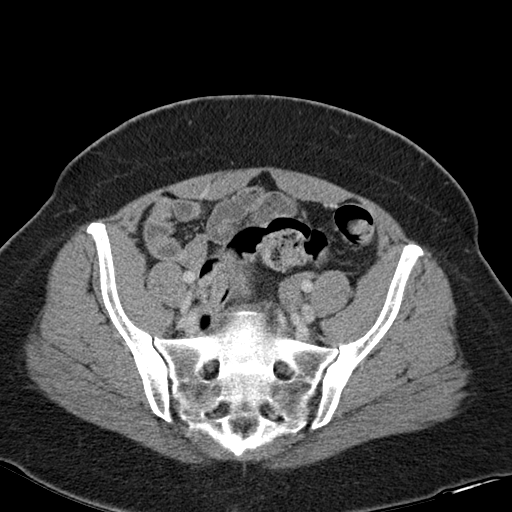
[im 32/91  soft-tissue]
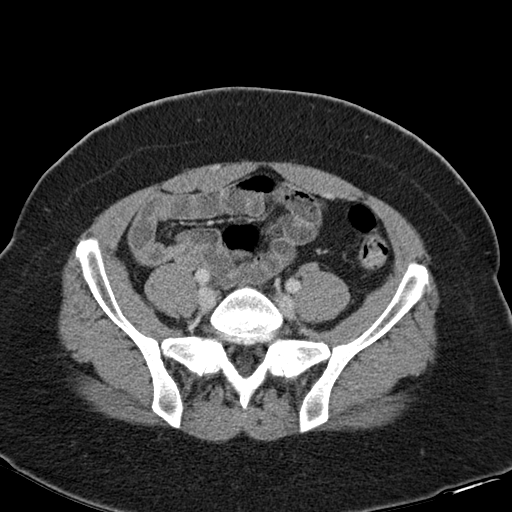
[im 38/91  soft-tissue]
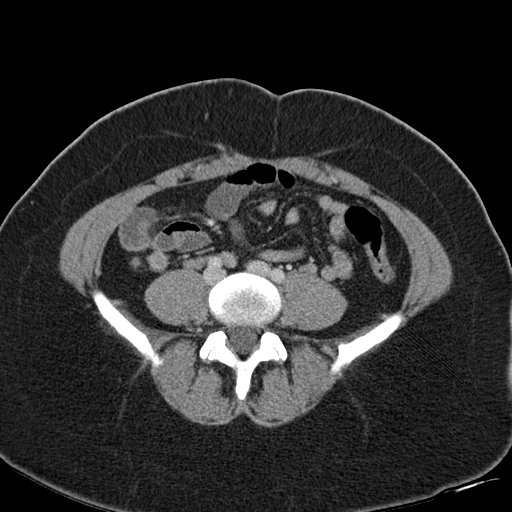
[im 48/91  soft-tissue]
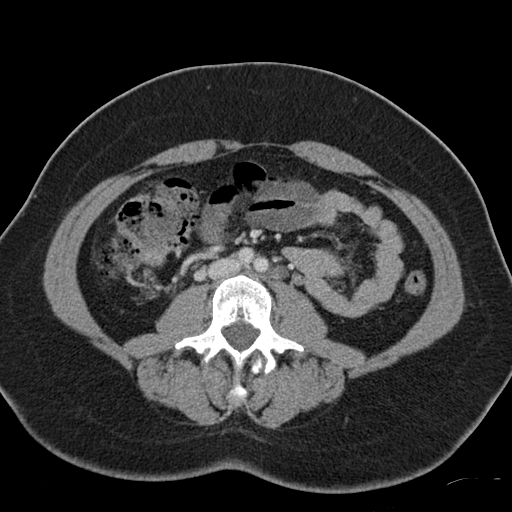
[im 53/91  soft-tissue]
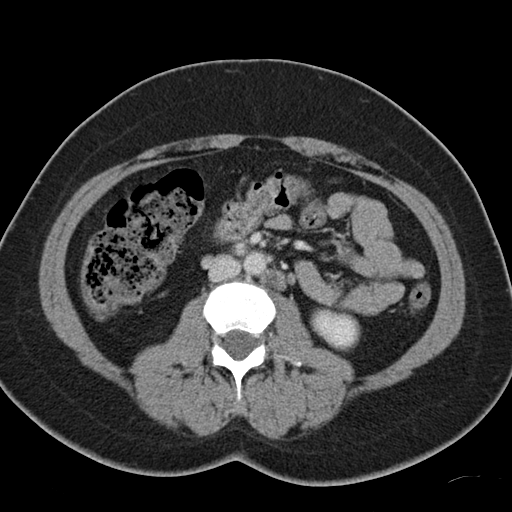
[im 59/91  soft-tissue]
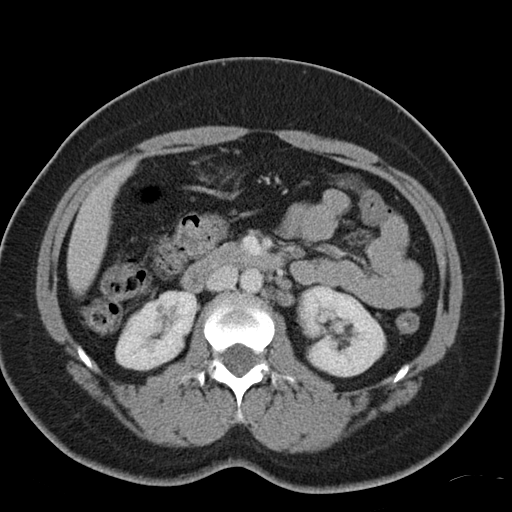
[im 59/91  bone]
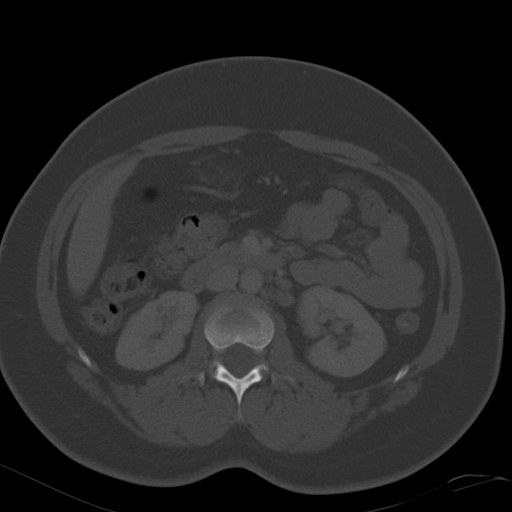
[im 64/91  soft-tissue]
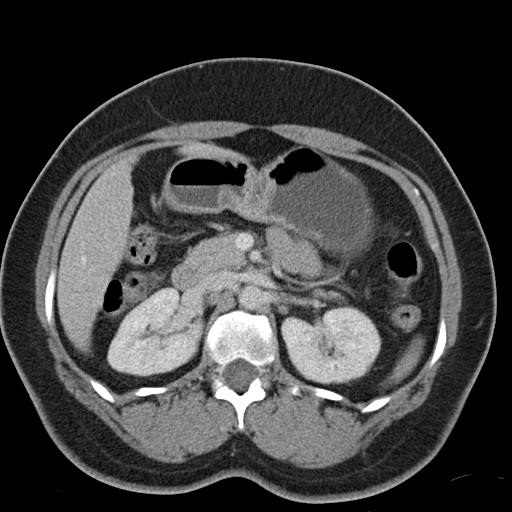
[im 69/91  soft-tissue]
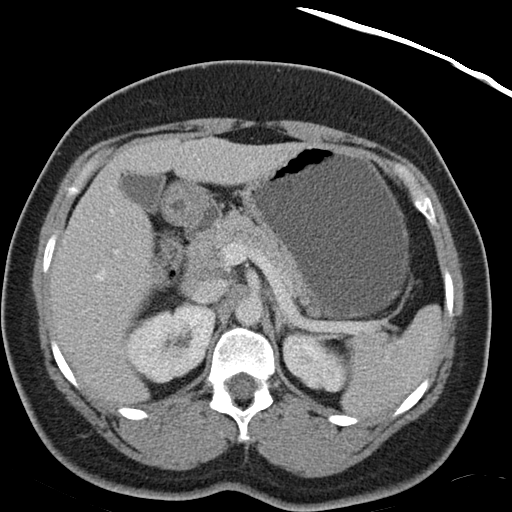
[im 80/91  soft-tissue]
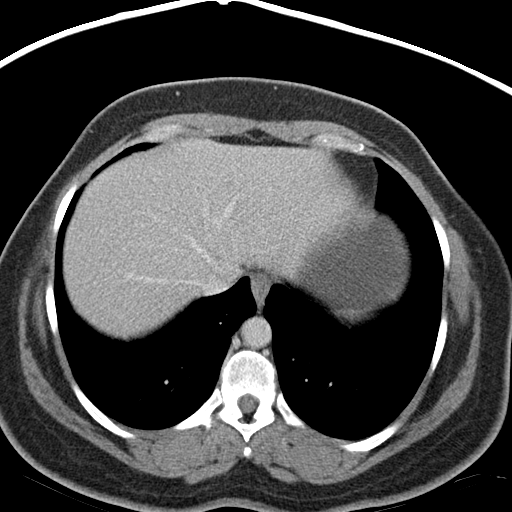
[im 85/91  soft-tissue]
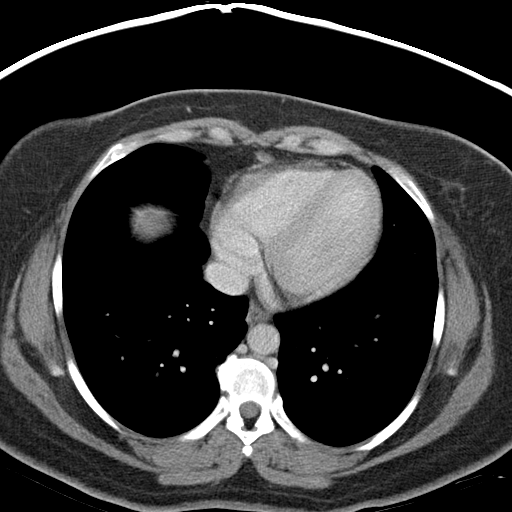

[Series 4: mpr cor post contrast (id) · coronal · 0.73mm/px · 3 of 82 slices shown]
[im 28/82  soft-tissue]
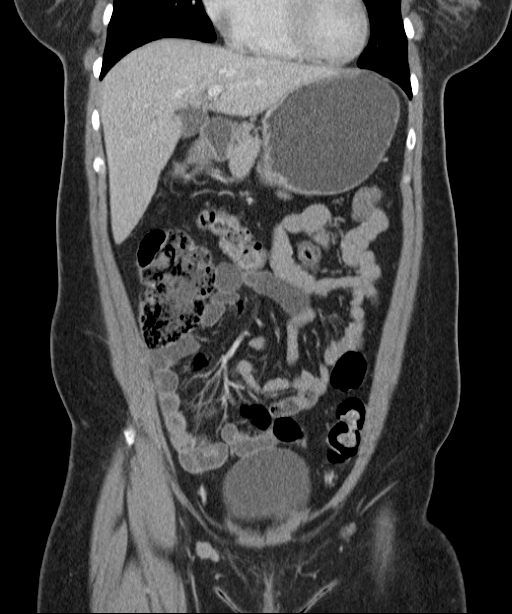
[im 37/82  soft-tissue]
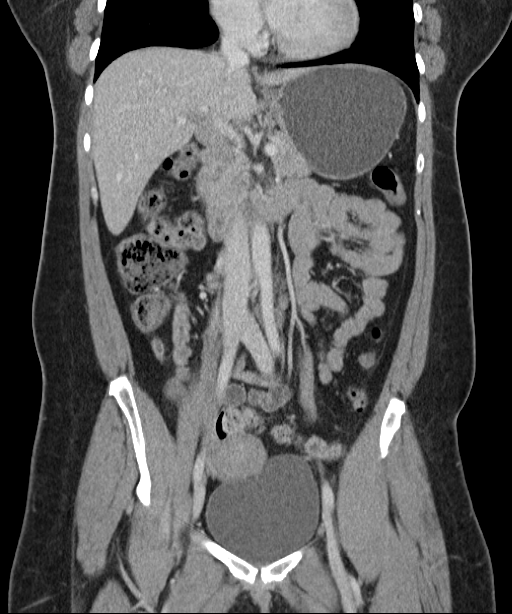
[im 46/82  soft-tissue]
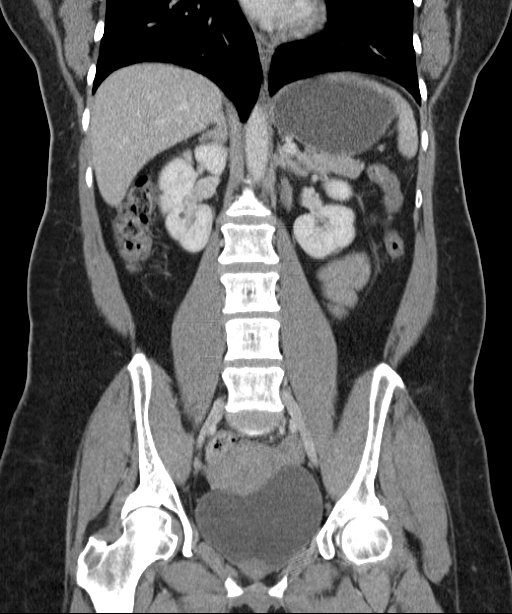

[16 of 46 positions shown; findings below may reference images not displayed]

FINDINGS: The appendix appears top normal to minimally prominent.
There is at most minimal thickening of the lateral coronal fascia.
Tiny right lower quadrant lymph nodes.  In the proper clinical
setting, early appendicitis cannot be excluded.

Mild prominence of the intrahepatic biliary ducts.

Porta hepatis, gastrohepatic and peripancreatic lymph nodes.
Periaortic enlarged lymph nodes measuring up to 1.9 x 1.2 cm
maximal transverse dimension.  Etiology/significance indeterminate.

No focal renal, splenic or adrenal lesion.  No calcified
gallstones.  No abdominal aortic aneurysm.
IMPRESSION: Early/subtle appendicitis could not be excluded in the
proper clinical / laboratory setting.

Mild prominence intrahepatic biliary ducts .

Adenopathy porta hepatis, gastrohepatic ligament, peripancreatic
region and periaortic region.  Etiology indeterminate.  The patient
may eventually benefit from MR of the abdomen for further
delineation.

Critical test results telephoned to Dr. Ange Amandine at the time of
interpretation on 05/25/2009 at [DATE] a.m.

## 2011-05-09 NOTE — Progress Notes (Signed)
cc: Consulting Physician:  Gerrit Halls, MD, c/o Arline Asp, MD, [facility], [address] [place], [place] [address], Fax [phone]  Primary Care Physician:  Same.  Referring Physician:  Cyril Mourning, NP, [facility] [address], [address], [address], [place], [place] [address], Fax [phone]     [facility] medical record number [id number]   Reason for visit:  Followup genotype 1a hepatitis C, teaching for commencement of therapy.   History:  The patient returns today unaccompanied. Since last being seen on 03/21/2011, there have been no interval events related to her liver  disease. There are no symptoms to suggest cryoglobulin mediated or decompensated liver disease.    Past medical history:  No interval change. It should be recalled there was an issue of concern about unintentional weight loss at last clinic appointment. She has gained 4 pounds since last being seen.   CURRENT MEDICATIONS:  None.   ALLERGIES:  Denies.   HABITS:  Smoking, never. Alcohol, denies interval consumption.   REVIEW OF SYSTEMS:  All 10 systems reviewed today with the patient and they are negative other than which is mentioned above. Her CES-D was not completed.   PHYSICAL EXAMINATION:   Constitutional:  Well appearing without significant peripheral wasting.   Vital signs:  Height 63 inches, weight 164 pounds, blood pressure 146/100, pulse 70, temperature 98 Fahrenheit.   ASSESSMENT:  The patient is a 46 year old woman with a history of genotype 1a hepatitis C, IL 28 B CC, with a biopsy on 08/11/2009, showing grade 2 stage I disease most consistent with hepatitis C. Although there was a  concern on the part of the referring physician and the outside pathologist that she had autoimmune hepatitis, this is not proven to be the case on our review of the biopsy and her lab testing. She is  coming today for teaching for Pegylated interferon, ribavirin, and telaprevir.   Today, I  discussed the treatment protocol for all 3 medications. I have demonstrated how to self inject her pegylated interferon. We discussed the dosing of the ribavirin. I demonstrated how to dose the  telaprevir. I reviewed the telaprevir side effect manual, dosing and explained the importance of dosing with a high fat food. We discussed  side effect management. She was given the teaching literature for telaprevir.   Plan:  1. Baseline HCV RNA drawn today. 2. Hepatitis A and B vaccinations. A. Hepatitis B number 1, completed. B. Hepatitis B number 2, completed.  C. She will need her third Hepatitis B vaccine by May 2013. D. Hepatitis A completed. E. She will need her third Hepatitis A vaccine by May 2013. 3. Viral load done today for purposes of documentation.  4. Her treatment start date will be 05/06/2011. 5. She will return in 2 weeks' time at week 2 of therapy.            Brooke Dare, MD   ADDENDUM:  Viral load  4 274 018  IU/mL.  403 .[id number]  D:  Thu Jan 10 14:16:07 2013 ; T:  Thu Jan 10 19:15:53 2013  Job #:  [id number]

## 2011-05-16 ENCOUNTER — Ambulatory Visit: Payer: BC Managed Care – PPO | Admitting: Gastroenterology

## 2011-06-02 IMAGING — MG MM DIGITAL DIAGNOSTIC UNILAT R
3 series · 3 of 3 positions shown · non-contrast
Comparison: None

CLINICAL DATA: The patient returns for evaluation of
calcifications in the right breast noted on recent screening study
dated 03/17/2009.

DIGITAL DIAGNOSTIC RIGHT MAMMOGRAM

[R CC]
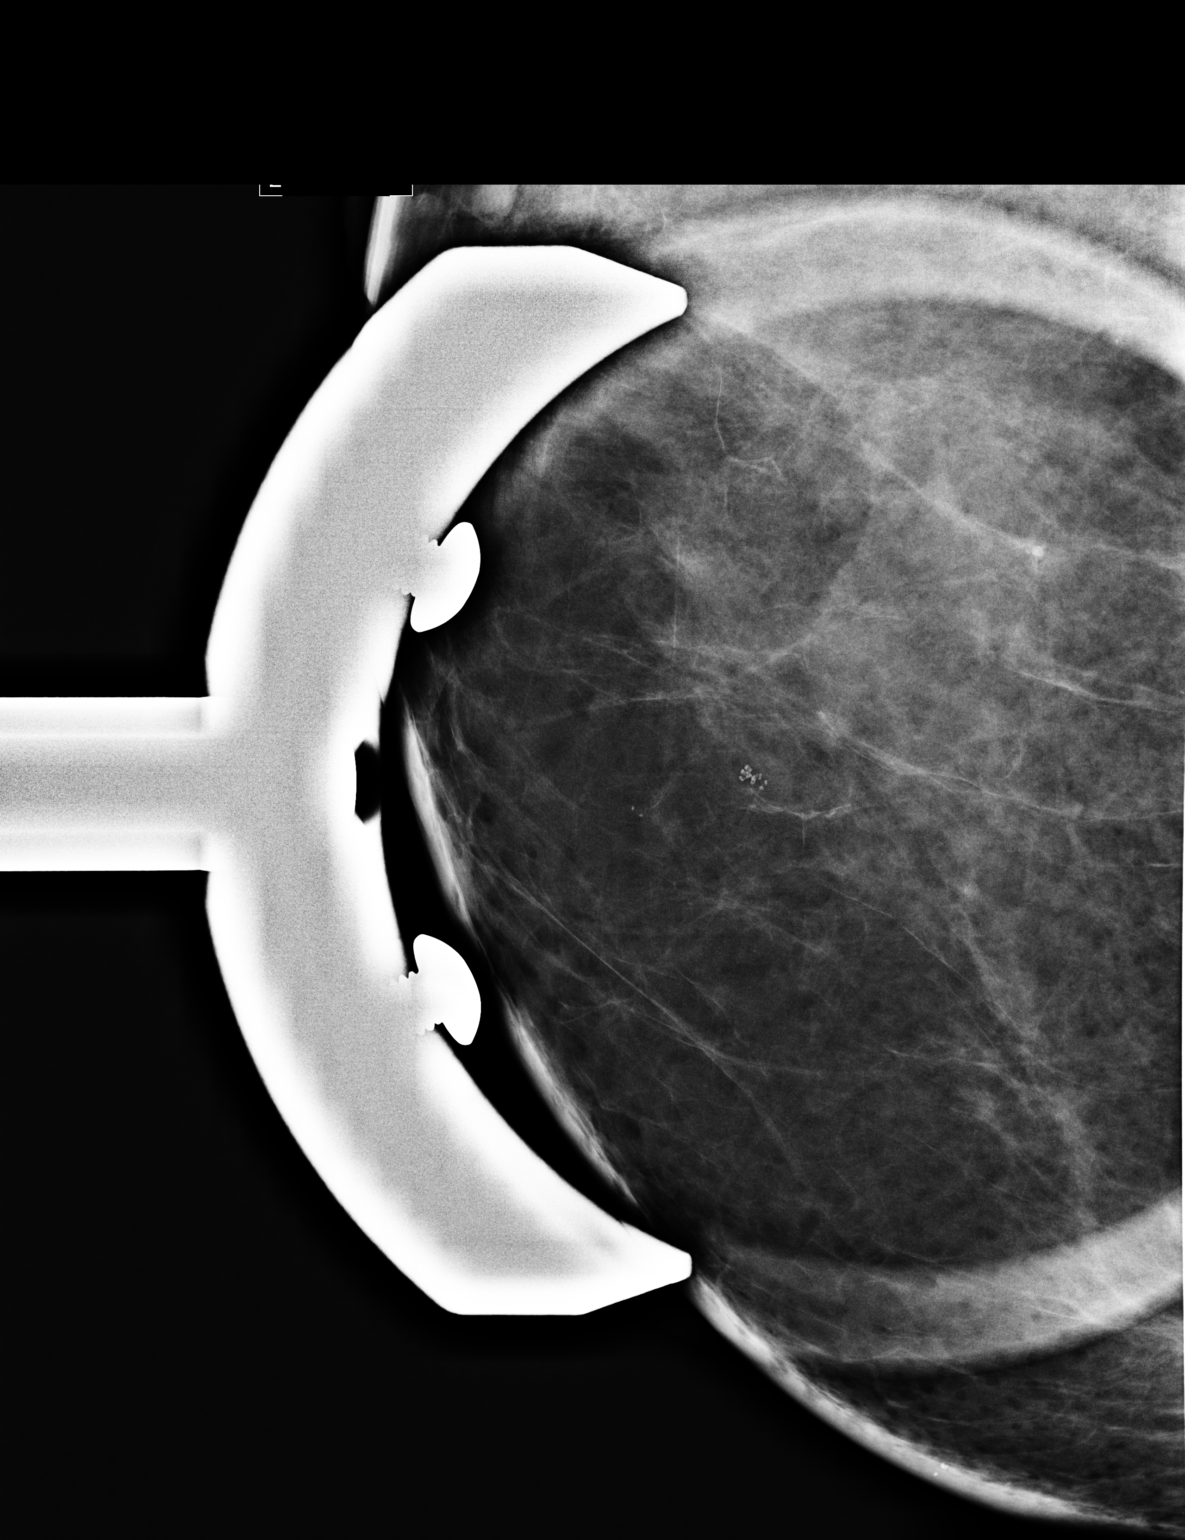

[R ML (1 of 2)]
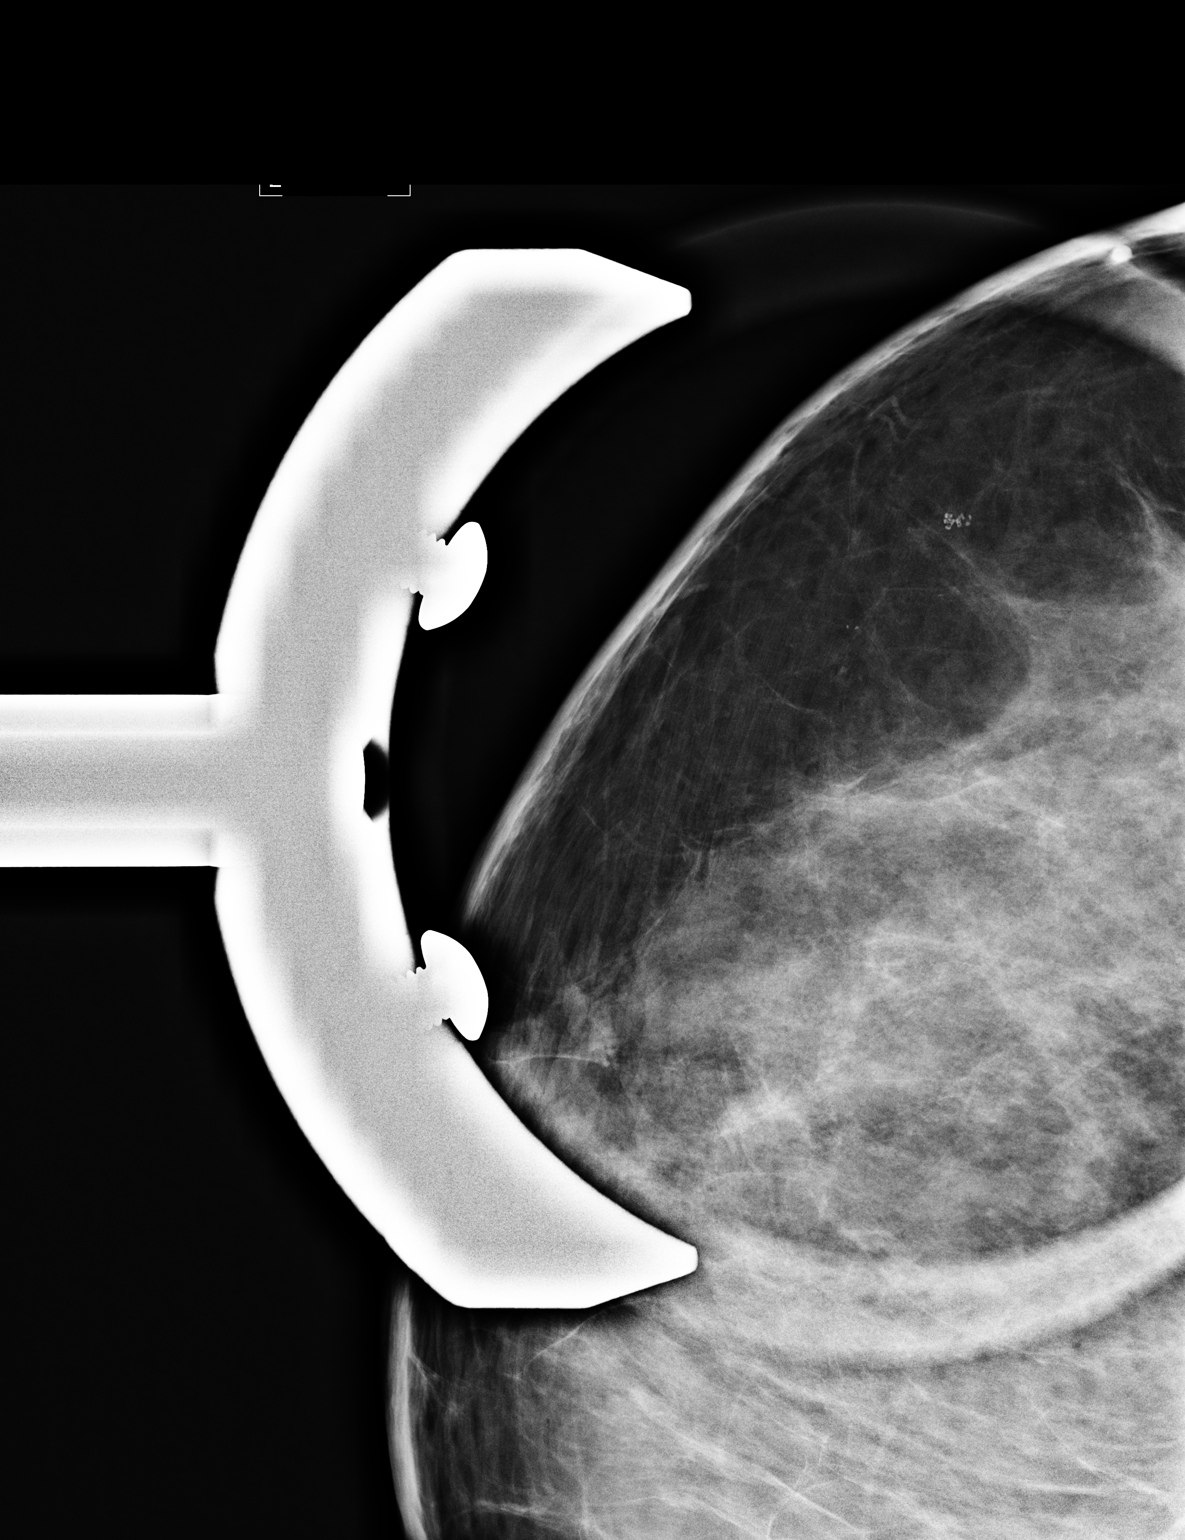

[R ML (2 of 2)]
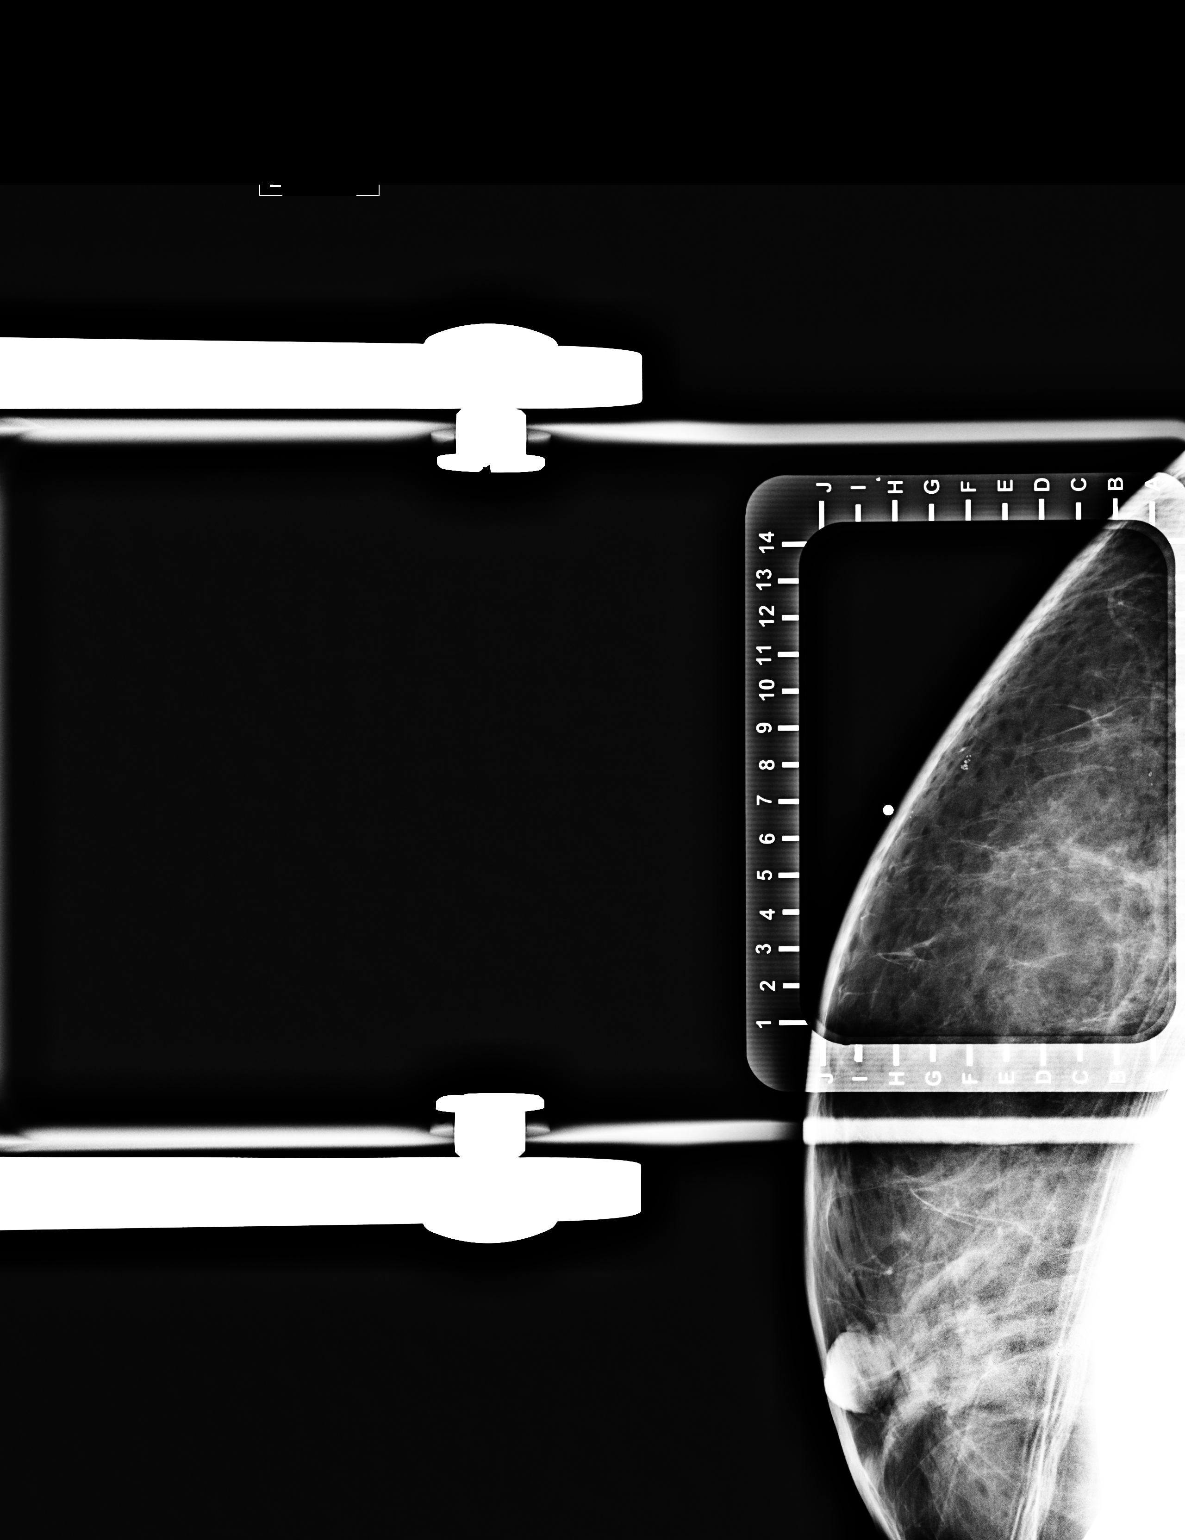

[3 of 3 positions shown; findings below may reference images not displayed]

FINDINGS: Magnification views demonstrate calcifications with
lucent centers that are benign configuration.  No suspicious linear
forms are noted.
IMPRESSION: No mammographic evidence of malignancy.  Yearly screening
mammography is suggested with next scheduled exam in February 2010.

BI-RADS CATEGORY 2:  Benign finding(s).

## 2011-09-19 ENCOUNTER — Ambulatory Visit: Payer: BC Managed Care – PPO | Admitting: Gastroenterology

## 2014-02-23 ENCOUNTER — Encounter: Payer: Self-pay | Admitting: *Deleted

## 2014-04-20 ENCOUNTER — Ambulatory Visit: Payer: Managed Care, Other (non HMO) | Admitting: Family Medicine

## 2014-05-02 ENCOUNTER — Ambulatory Visit (INDEPENDENT_AMBULATORY_CARE_PROVIDER_SITE_OTHER): Payer: Managed Care, Other (non HMO) | Admitting: Family Medicine

## 2014-05-02 ENCOUNTER — Encounter: Payer: Self-pay | Admitting: Family Medicine

## 2014-05-02 ENCOUNTER — Other Ambulatory Visit: Payer: Self-pay | Admitting: Family Medicine

## 2014-05-02 VITALS — BP 128/74 | HR 76 | Temp 97.9°F | Resp 14 | Ht 63.0 in | Wt 172.0 lb

## 2014-05-02 DIAGNOSIS — Z1211 Encounter for screening for malignant neoplasm of colon: Secondary | ICD-10-CM

## 2014-05-02 DIAGNOSIS — Z113 Encounter for screening for infections with a predominantly sexual mode of transmission: Secondary | ICD-10-CM

## 2014-05-02 DIAGNOSIS — Z124 Encounter for screening for malignant neoplasm of cervix: Secondary | ICD-10-CM

## 2014-05-02 DIAGNOSIS — Z23 Encounter for immunization: Secondary | ICD-10-CM

## 2014-05-02 DIAGNOSIS — Z1231 Encounter for screening mammogram for malignant neoplasm of breast: Secondary | ICD-10-CM

## 2014-05-02 DIAGNOSIS — Z0000 Encounter for general adult medical examination without abnormal findings: Secondary | ICD-10-CM

## 2014-05-02 DIAGNOSIS — B182 Chronic viral hepatitis C: Secondary | ICD-10-CM

## 2014-05-02 DIAGNOSIS — Z1239 Encounter for other screening for malignant neoplasm of breast: Secondary | ICD-10-CM

## 2014-05-02 DIAGNOSIS — N951 Menopausal and female climacteric states: Secondary | ICD-10-CM

## 2014-05-02 DIAGNOSIS — R232 Flushing: Secondary | ICD-10-CM | POA: Insufficient documentation

## 2014-05-02 LAB — COMPREHENSIVE METABOLIC PANEL
ALK PHOS: 99 U/L (ref 39–117)
ALT: 157 U/L — AB (ref 0–35)
AST: 96 U/L — ABNORMAL HIGH (ref 0–37)
Albumin: 4.2 g/dL (ref 3.5–5.2)
BILIRUBIN TOTAL: 0.5 mg/dL (ref 0.2–1.2)
BUN: 12 mg/dL (ref 6–23)
CALCIUM: 9.2 mg/dL (ref 8.4–10.5)
CO2: 25 meq/L (ref 19–32)
Chloride: 105 mEq/L (ref 96–112)
Creat: 0.77 mg/dL (ref 0.50–1.10)
Glucose, Bld: 89 mg/dL (ref 70–99)
POTASSIUM: 3.8 meq/L (ref 3.5–5.3)
SODIUM: 140 meq/L (ref 135–145)
TOTAL PROTEIN: 7.9 g/dL (ref 6.0–8.3)

## 2014-05-02 LAB — CBC WITH DIFFERENTIAL/PLATELET
BASOS PCT: 0 % (ref 0–1)
Basophils Absolute: 0 10*3/uL (ref 0.0–0.1)
EOS ABS: 0 10*3/uL (ref 0.0–0.7)
Eosinophils Relative: 1 % (ref 0–5)
HCT: 40.3 % (ref 36.0–46.0)
HEMOGLOBIN: 14 g/dL (ref 12.0–15.0)
LYMPHS ABS: 2.5 10*3/uL (ref 0.7–4.0)
Lymphocytes Relative: 53 % — ABNORMAL HIGH (ref 12–46)
MCH: 30.4 pg (ref 26.0–34.0)
MCHC: 34.7 g/dL (ref 30.0–36.0)
MCV: 87.4 fL (ref 78.0–100.0)
MPV: 9.1 fL (ref 8.6–12.4)
Monocytes Absolute: 0.2 10*3/uL (ref 0.1–1.0)
Monocytes Relative: 5 % (ref 3–12)
NEUTROS ABS: 2 10*3/uL (ref 1.7–7.7)
Neutrophils Relative %: 41 % — ABNORMAL LOW (ref 43–77)
PLATELETS: 159 10*3/uL (ref 150–400)
RBC: 4.61 MIL/uL (ref 3.87–5.11)
RDW: 13.7 % (ref 11.5–15.5)
WBC: 4.8 10*3/uL (ref 4.0–10.5)

## 2014-05-02 LAB — LIPID PANEL
CHOL/HDL RATIO: 3.4 ratio
CHOLESTEROL: 162 mg/dL (ref 0–200)
HDL: 48 mg/dL (ref >39)
LDL CALC: 94 mg/dL (ref 0–99)
TRIGLYCERIDES: 98 mg/dL (ref <150)
VLDL: 20 mg/dL (ref 0–40)

## 2014-05-02 LAB — WET PREP FOR TRICH, YEAST, CLUE
CLUE CELLS WET PREP: NONE SEEN
TRICH WET PREP: NONE SEEN
YEAST WET PREP: NONE SEEN

## 2014-05-02 LAB — RPR

## 2014-05-02 NOTE — Addendum Note (Signed)
Addended by: Sanah Kraska, [place]ORD[place] on: 05/02/2014 11:29 AM   Modules accepted: Orders

## 2014-05-02 NOTE — Patient Instructions (Signed)
Tetanus and Hepatitis B and C VACCINE given Mammogram to be scheduled Referral to Hepatitis C clinic for treatment Referral for colonoscopy We will call with lab results We will discuss hormone therapy for hot flashes after treatment for Hepatitis C F/U as needed

## 2014-05-02 NOTE — Assessment & Plan Note (Signed)
untreated Hep C, pt agrees to treatment, given Hep A and B vaccines today

## 2014-05-02 NOTE — Addendum Note (Signed)
Addended by: Phillips OdorSIX, CHRISTINA H on: 05/02/2014 11:58 AM   Modules accepted: Orders

## 2014-05-02 NOTE — Assessment & Plan Note (Signed)
Will treat after decision made about Hep C treatments Has tried OTC meds, transdermal patch in past

## 2014-05-02 NOTE — Progress Notes (Signed)
Subjective:    Patient ID: [name], female    DOB: 08/22/1965, 49 y.o.   MRN: [id number]  Patient presents for CPE with PAP patient here for complete physical exam with Pap smear. She's also here to establish care. She was briefly followed by [facility] but she did not have a primary care provider. Her last Pap smear mammogram was back in 2012. She was also diagnosed with hepatitis C in 2012 she was seen by the hepatitis C clinic, however she was given 3 different medications to take as well as an injection and she thought it was too much for her therefore she only took the medicine for about a week and 2 and never went back to the hepatitis C clinic per hepatitis C is thought to be sexually transmitted. She's not had any episodes of jaundice nausea vomiting bloating. She does not think that she receive hepatitis A or B vaccines. She is also due for tetanus booster. She is overdue for mammogram and colonoscopy. Her only complaint today is hot flashes she's had for the past year or so she's not had a period in about 2 years. She is not sexually active    Review Of Systems: Per above  GEN- denies fatigue, fever, weight loss,weakness, recent illness HEENT- denies eye drainage, change in vision, nasal discharge, CVS- denies chest pain, palpitations RESP- denies SOB, cough, wheeze ABD- denies N/V, change in stools, abd pain GU- denies dysuria, hematuria, dribbling, incontinence MSK- denies joint pain, muscle aches, injury Neuro- denies headache, dizziness, syncope, seizure activity       Objective:    BP 128/74 mmHg  Pulse 76  Temp(Src) 97.9 F (36.6 C) (Oral)  Resp 14  Ht  (1.6 m)  Wt 172 lb (78.019 kg)  BMI 30.48 kg/m2  LMP 01/22/2011 GEN- NAD, alert and oriented x3 HEENT- PERRL, EOMI, non injected sclera, pink conjunctiva, MMM, oropharynx clear Neck- Supple, no thyromegaly Breast- normal symmetry, no nipple inversion,no nipple drainage, no nodules or  lumps felt Nodes- no axillary nodes CVS- RRR, no murmur RESP-CTAB ABD-NABS,soft,NT,ND, no HSM GU- normal external genitalia, vaginal mucosa pink and moist, cervix visualized no growth- stenosed os, no blood form os, minimal thin clear discharge, no CMT, no ovarian masses, uterus normal size Rectum- normal  Tone, FOBT neg Skin- normal - no Jaundice EXT- No edema Psych- normal affect and mood Pulses- Radial, DP- 2+        Assessment & Plan:      Problem List Items Addressed This Visit      Unprioritized   Screening for colon cancer   Chronic hepatitis C virus infection - Primary   Relevant Orders      HIV antibody      RPR      HCV RNA quant rflx ultra or genotyp    Other Visit Diagnoses    Routine general medical examination at a health care facility        CPE done, TDAP given, fasting labs, pt will schedule Mammogram, referral for colonsocopy    Relevant Orders       CBC with Differential       Comprehensive metabolic panel       Lipid panel    Cervical cancer screening        Relevant Orders       PAP, ThinPrep ASCUS Rflx HPV Rflx Type    Screen for STD (sexually transmitted disease)        STD  screen, HIV at request    Relevant Orders       GC/chlamydia probe amp, genital       WET PREP FOR TRICH, YEAST, CLUE       Note: This dictation was prepared with Dragon dictation along with smaller phrase technology. Any transcriptional errors that result from this process are unintentional.

## 2014-05-03 LAB — PAP THINPREP ASCUS RFLX HPV RFLX TYPE

## 2014-05-03 LAB — GC/CHLAMYDIA PROBE AMP
CT PROBE, AMP APTIMA: NEGATIVE
GC PROBE AMP APTIMA: NEGATIVE

## 2014-05-03 LAB — HIV ANTIBODY (ROUTINE TESTING W REFLEX): HIV 1&2 Ab, 4th Generation: NONREACTIVE

## 2014-05-03 LAB — HCV RNA QUANT RFLX ULTRA OR GENOTYP
HCV QUANT LOG: 6.71 {Log} — AB (ref <1.18)
HCV QUANT: 5168224 [IU]/mL — AB (ref <15)

## 2014-05-05 ENCOUNTER — Telehealth: Payer: Self-pay | Admitting: *Deleted

## 2014-05-05 LAB — HEPATITIS C GENOTYPE

## 2014-05-05 NOTE — Telephone Encounter (Signed)
Pt called wanting to know the results of her test bw and pap, pt is aware that everything was normal

## 2014-05-06 ENCOUNTER — Encounter: Payer: Self-pay | Admitting: *Deleted

## 2014-05-06 ENCOUNTER — Ambulatory Visit (HOSPITAL_COMMUNITY): Payer: Self-pay

## 2014-05-09 ENCOUNTER — Other Ambulatory Visit: Payer: Self-pay | Admitting: Family Medicine

## 2014-05-09 DIAGNOSIS — Z1231 Encounter for screening mammogram for malignant neoplasm of breast: Secondary | ICD-10-CM

## 2014-05-12 ENCOUNTER — Other Ambulatory Visit: Payer: Self-pay

## 2014-05-16 ENCOUNTER — Ambulatory Visit (HOSPITAL_COMMUNITY): Payer: Self-pay

## 2014-05-16 ENCOUNTER — Ambulatory Visit (HOSPITAL_COMMUNITY)
Admission: RE | Admit: 2014-05-16 | Discharge: 2014-05-16 | Disposition: A | Discharge status: Home / Self Care | Payer: Managed Care, Other (non HMO) | Source: Ambulatory Visit | Attending: Family Medicine | Admitting: Family Medicine

## 2014-05-16 DIAGNOSIS — Z1231 Encounter for screening mammogram for malignant neoplasm of breast: Secondary | ICD-10-CM | POA: Insufficient documentation

## 2014-05-16 DIAGNOSIS — Z1239 Encounter for other screening for malignant neoplasm of breast: Secondary | ICD-10-CM

## 2014-05-20 ENCOUNTER — Other Ambulatory Visit: Payer: Self-pay

## 2014-05-30 ENCOUNTER — Other Ambulatory Visit: Payer: Managed Care, Other (non HMO)

## 2014-05-30 DIAGNOSIS — B1710 Acute hepatitis C without hepatic coma: Secondary | ICD-10-CM

## 2014-05-30 LAB — HEPATITIS A ANTIBODY, TOTAL: HEP A TOTAL AB: REACTIVE — AB

## 2014-05-30 LAB — HEPATITIS B SURFACE ANTIBODY,QUALITATIVE: HEP B S AB: POSITIVE — AB

## 2014-05-30 LAB — IRON: IRON: 147 ug/dL — AB (ref 42–145)

## 2014-05-31 LAB — PROTIME-INR
INR: 1.14 (ref <1.50)
PROTHROMBIN TIME: 14.6 s (ref 11.6–15.2)

## 2014-05-31 LAB — HCV RNA QUANT RFLX ULTRA OR GENOTYP
HCV QUANT LOG: 6.65 {Log} — AB (ref <1.18)
HCV QUANT: 4455574 [IU]/mL — AB (ref <15)

## 2014-05-31 LAB — ANA: ANA: NEGATIVE

## 2014-06-03 LAB — HEPATITIS C GENOTYPE

## 2014-07-13 ENCOUNTER — Encounter: Payer: Self-pay | Admitting: Internal Medicine

## 2014-07-13 ENCOUNTER — Encounter: Payer: Self-pay | Admitting: *Deleted

## 2014-07-13 ENCOUNTER — Ambulatory Visit (INDEPENDENT_AMBULATORY_CARE_PROVIDER_SITE_OTHER): Payer: Managed Care, Other (non HMO) | Admitting: Internal Medicine

## 2014-07-13 VITALS — BP 145/87 | HR 77 | Temp 98.7°F | Ht 64.0 in | Wt 169.0 lb

## 2014-07-13 DIAGNOSIS — B182 Chronic viral hepatitis C: Secondary | ICD-10-CM

## 2014-07-13 MED ORDER — LEDIPASVIR-SOFOSBUVIR 90-400 MG PO TABS: 1.0000 | ORAL_TABLET | Freq: Every day | ORAL | Status: DC

## 2014-07-13 NOTE — Progress Notes (Signed)
Patient ID: [name], female   DOB: 1965/05/02, 49 y.o.   MRN: [id number] Due to patient's work schedule; she can only accept phone calls prior to 2:00 pm.

## 2014-07-13 NOTE — Progress Notes (Signed)
+[name] is a 49 y.o. female who presents for initial evaluation and management of a positive Hepatitis C antibody test.  Patient tested positive several years ago. Hepatitis C risk factors present are: tattoo. Patient denies history of blood transfusion, intranasal drug use, IV drug abuse, multiple sexual partners, renal dialysis, sexual contact with person with liver disease. Patient has had other studies performed. Results: hepatitis C RNA by PCR, result: positive. Patient has not had prior treatment for Hepatitis C. Patient does not have a past history of liver disease. Patient does not have a family history of liver disease.   HPI: She was previouslyseen by hepatology from [facility] in [place] and was to start PEG/riba with telapravir but only took one week after one shot of interferon.  Did not like effects.   Getting hepatitis A and B from primary office.    Patient does have documented immunity to Hepatitis A. Patient does have documented immunity to Hepatitis B.     Review of Systems A comprehensive review of systems was negative.   Past Medical History  Diagnosis Date  . 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 Hepatitis C antibody test positive     HCV RNA load 1960    Prior to Admission medications   Not on File    No Known Allergies  History  Substance Use Topics  . Smoking status: Never Smoker   . Smokeless tobacco: Never Used  . Alcohol Use: 2.4 oz/week    2 Cans of beer, 2 Shots of liquor per week     Comment: socially    Family History  Problem Relation Age of Onset  . Colon cancer Neg Hx   . Hypertension Sister   . Hypertension Brother   . Hypertension Brother   . Hypertension Sister       Objective:  There were no vitals filed for this visit. in no apparent distress and alert HEENT: anicteric Cor RRR and No murmurs clear Bowel sounds are normal, liver is not enlarged, spleen is not enlarged peripheral pulses normal, no pedal edema, no clubbing or  cyanosis negative for - jaundice, spider hemangioma, telangiectasia, palmar erythema, ecchymosis and atrophy  Laboratory Genotype:  Lab Results  Component Value Date   HCVGENOTYPE 1a 05/30/2014   HCV viral load:  Lab Results  Component Value Date   HCVQUANT [id number]* 05/30/2014   Lab Results  Component Value Date   WBC 4.8 05/02/2014   HGB 14.0 05/02/2014   HCT 40.3 05/02/2014   MCV 87.4 05/02/2014   PLT 159 05/02/2014    Lab Results  Component Value Date   CREATININE 0.77 05/02/2014   BUN 12 05/02/2014   NA 140 05/02/2014   K 3.8 05/02/2014   CL 105 05/02/2014   CO2 25 05/02/2014    Lab Results  Component Value Date   ALT 157* 05/02/2014   AST 96* 05/02/2014   ALKPHOS 99 05/02/2014   BILITOT 0.5 05/02/2014   INR 1.14 05/30/2014      Assessment: Chronic Hepatitis C genotype 1a  Plan: 1) Patient counseled extensively on limiting acetaminophen to no more than 2 grams daily, avoidance of alcohol. 2) Transmission discussed with patient including sexual transmission, sharing razors and toothbrush.   3) Will need referral to gastroenterology if concern for cirrhosis 4) Will need referral for substance abuse counseling: No. 5) Will prescribe Harvoni for 12 weeks today, pending PA 6) Hepatitis A vaccine No. 7) Hepatitis B vaccine No. 8) Pneumovax vaccine if  concern for cirrhosis 9) will follow up after starting medication

## 2014-07-13 NOTE — Patient Instructions (Signed)
Date 07/13/2014  Dear Ms [name], As discussed in the ID Clinic, your hepatitis C therapy will include the following medications:          Harvoni 90mg /400mg  tablet:           Take 1 tablet by mouth once daily   Please note that ALL MEDICATIONS WILL START ON THE SAME DATE for a total of 12 weeks. ---------------------------------------------------------------- Your HCV Treatment Start Date: TBA   Your HCV genotype:  1a    Liver Fibrosis: TBD (previously F1 in 2012)   ---------------------------------------------------------------- YOUR PHARMACY CONTACT:   [facility] [address] Phone: [phone] Hours: Monday to Friday 7:30 am to 6:00 pm   Please always contact your pharmacy at least 3-4 business days before you run out of medications to ensure your next month's medication is ready or 1 week prior to running out if you receive it by mail.  Remember, each prescription is for 28 days. ---------------------------------------------------------------- GENERAL NOTES REGARDING YOUR HEPATITIS C MEDICATION:  SOFOSBUVIR/LEDIPASVIR (HARVONI): - Harvoni tablet is taken daily with OR without food. - The tablets are orange. - The tablets should be stored at room temperature.  - Acid reducing agents such as H2 blockers (ie. Pepcid (famotidine), Zantac (ranitidine), Tagamet (cimetidine), Axid (nizatidine) and proton pump inhibitors (ie. Prilosec (omeprazole), Protonix (pantoprazole), Nexium (esomeprazole), or Aciphex (rabeprazole)) can decrease effectiveness of Harvoni. Do not take until you have discussed with a health care provider.    -Antacids that contain magnesium and/or aluminum hydroxide (ie. Milk of Magensia, Rolaids, Gaviscon, Maalox, Mylanta, an dArthritis Pain Formula)can reduce absorption of Harvoni, so take them at least 4 hours before or after Harvoni.  -Calcium carbonate (calcium supplements or antacids  such as Tums, Caltrate, Os-Cal)needs to be taken at least 4 hours hours before or after Harvoni.  -St. John's wort or any products that contain St. John's wort like some herbal supplements  Please inform the office prior to starting any of these medications.  - The common side effects with Harvoni:      1. Fatigue      2. Headache      3. Nausea      4. Diarrhea      5. Insomnia   Support Path is a suite of resources designed to help patients start with HARVONI and move toward treatment completion GETTING STARTED Support Path helps patients access therapy and get off to an efficient start  Benefits investigation and prior authorization support Co-pay and other financial assistance A specialty pharmacy finder CO-PAY COUPON The HARVONI co-pay coupon may help eligible patients lower their out-of-pocket costs. With a co-pay coupon, most eligible patients may pay no more than $5 per co-pay (restrictions apply) www.harvoni.com call [phone] Not valid for patients enrolled in government healthcare prescription drug programs, such as Medicare Part D and Medicaid. Patients in the coverage gap known as the "donut hole" also are not eligible The HARVONI co-pay coupon program will cover the out-of-pocket costs for HARVONI prescriptions up to a maximum of 25% of the catalog price of a 12-week regimen of HARVONI  Please note that this only lists the most common side effects and is NOT a comprehensive list of the potential side effects of these medications. For more information, please review the drug information sheets that come with your medication package from the pharmacy.  ---------------------------------------------------------------- GENERAL HELPFUL HINTS ON HCV THERAPY: 1. No alcohol. 2. Protect against sun-sensitivity/sunburns (wear sunglasses, hat,  long sleeves, pants and sunscreen). 3. Stay well-hydrated/well-moisturized. 4. Notify the ID Clinic of any changes in your other  over-the-counter/herbal or prescription medications. 5. If you miss a dose of your medication, take the missed dose as soon as you remember. Return to your regular time/dose schedule the next day.  6.  Do not stop taking your medications without first talking with your healthcare provider. 7.  You may take Tylenol (acetaminophen), as long as the dose is less than 2000 mg (OR no more than 4 tablets of the Tylenol Extra Strengths 500mg  tablet) in 24 hours. 8.  You will need to obtain routine labs and/or office visits at [facility] at weeks 2, 4, 8,  and 12 as well as 12 and 24 weeks after completion of treatment.   Scharlene Gloss, [place] [facility] [place],   [phone]

## 2014-07-15 ENCOUNTER — Ambulatory Visit (HOSPITAL_COMMUNITY)
Admission: RE | Admit: 2014-07-15 | Discharge: 2014-07-15 | Disposition: A | Discharge status: Home / Self Care | Payer: Managed Care, Other (non HMO) | Source: Ambulatory Visit | Attending: Internal Medicine | Admitting: Internal Medicine

## 2014-07-15 DIAGNOSIS — B182 Chronic viral hepatitis C: Secondary | ICD-10-CM | POA: Diagnosis not present

## 2014-08-29 ENCOUNTER — Other Ambulatory Visit: Payer: Self-pay | Admitting: Internal Medicine

## 2014-08-29 DIAGNOSIS — B182 Chronic viral hepatitis C: Secondary | ICD-10-CM

## 2014-09-13 ENCOUNTER — Telehealth: Payer: Self-pay | Admitting: *Deleted

## 2014-09-13 NOTE — Telephone Encounter (Signed)
Pharmacy called, unable to reach patient to schedule delivery of Harvoni.  Transfered call to [name], pharmacist for assistance.

## 2014-09-13 NOTE — Progress Notes (Signed)
Please see message. °

## 2014-09-16 ENCOUNTER — Other Ambulatory Visit: Payer: Managed Care, Other (non HMO)

## 2014-09-20 ENCOUNTER — Other Ambulatory Visit (INDEPENDENT_AMBULATORY_CARE_PROVIDER_SITE_OTHER): Payer: Managed Care, Other (non HMO)

## 2014-09-20 DIAGNOSIS — B182 Chronic viral hepatitis C: Secondary | ICD-10-CM

## 2014-09-20 LAB — CBC WITH DIFFERENTIAL/PLATELET
Basophils Absolute: 0.1 10*3/uL (ref 0.0–0.1)
Basophils Relative: 1 % (ref 0–1)
Eosinophils Absolute: 0.1 10*3/uL (ref 0.0–0.7)
Eosinophils Relative: 1 % (ref 0–5)
HEMATOCRIT: 38.6 % (ref 36.0–46.0)
Hemoglobin: 13 g/dL (ref 12.0–15.0)
LYMPHS PCT: 58 % — AB (ref 12–46)
Lymphs Abs: 3.1 10*3/uL (ref 0.7–4.0)
MCH: 29.7 pg (ref 26.0–34.0)
MCHC: 33.7 g/dL (ref 30.0–36.0)
MCV: 88.1 fL (ref 78.0–100.0)
MPV: 8.9 fL (ref 8.6–12.4)
Monocytes Absolute: 0.3 10*3/uL (ref 0.1–1.0)
Monocytes Relative: 6 % (ref 3–12)
NEUTROS PCT: 34 % — AB (ref 43–77)
Neutro Abs: 1.8 10*3/uL (ref 1.7–7.7)
Platelets: 172 10*3/uL (ref 150–400)
RBC: 4.38 MIL/uL (ref 3.87–5.11)
RDW: 13.4 % (ref 11.5–15.5)
WBC: 5.3 10*3/uL (ref 4.0–10.5)

## 2014-09-20 LAB — COMPREHENSIVE METABOLIC PANEL
ALT: 13 U/L (ref 0–35)
AST: 15 U/L (ref 0–37)
Albumin: 4 g/dL (ref 3.5–5.2)
Alkaline Phosphatase: 72 U/L (ref 39–117)
BUN: 14 mg/dL (ref 6–23)
CALCIUM: 9.2 mg/dL (ref 8.4–10.5)
CHLORIDE: 105 meq/L (ref 96–112)
CO2: 26 mEq/L (ref 19–32)
CREATININE: 0.86 mg/dL (ref 0.50–1.10)
GLUCOSE: 91 mg/dL (ref 70–99)
POTASSIUM: 3.9 meq/L (ref 3.5–5.3)
Sodium: 140 mEq/L (ref 135–145)
Total Bilirubin: 0.4 mg/dL (ref 0.2–1.2)
Total Protein: 7.1 g/dL (ref 6.0–8.3)

## 2014-09-21 LAB — HEPATITIS C RNA QUANTITATIVE: HCV Quantitative: NOT DETECTED IU/mL (ref <15)

## 2014-09-27 ENCOUNTER — Ambulatory Visit: Payer: Managed Care, Other (non HMO) | Admitting: Internal Medicine

## 2014-10-13 ENCOUNTER — Telehealth: Payer: Self-pay | Admitting: *Deleted

## 2014-10-13 ENCOUNTER — Ambulatory Visit: Payer: Managed Care, Other (non HMO) | Admitting: Internal Medicine

## 2014-10-13 NOTE — Telephone Encounter (Signed)
Patient no-showed today's appointment.  Per Dr. Luciana Axe, ok for patient to schedule labs in 1 month, follow up 5 weeks with him.  Need to know if the patient is still taking Harvoni, if she missed any doses, as we have had a call from her pharmacy when they could not reach her to confirm delivery. Andree Coss, RN

## 2014-11-24 ENCOUNTER — Encounter: Payer: Self-pay | Admitting: Internal Medicine

## 2014-11-24 ENCOUNTER — Ambulatory Visit (INDEPENDENT_AMBULATORY_CARE_PROVIDER_SITE_OTHER): Payer: Managed Care, Other (non HMO) | Admitting: Internal Medicine

## 2014-11-24 VITALS — BP 147/84 | HR 71 | Temp 98.1°F | Ht 63.0 in | Wt 173.0 lb

## 2014-11-24 DIAGNOSIS — Z23 Encounter for immunization: Secondary | ICD-10-CM | POA: Diagnosis not present

## 2014-11-24 DIAGNOSIS — B182 Chronic viral hepatitis C: Secondary | ICD-10-CM

## 2014-11-24 DIAGNOSIS — K7400 Hepatic fibrosis, unspecified: Secondary | ICD-10-CM

## 2014-11-25 DIAGNOSIS — K7400 Hepatic fibrosis, unspecified: Secondary | ICD-10-CM | POA: Insufficient documentation

## 2014-11-25 NOTE — Progress Notes (Signed)
   Subjective:    Patient ID: [name], female    DOB: Dec 20, 1965, 50 y.o.   MRN: [id number]  HPI Here for follow up of HCV.  Previously seen prior to treatment and started, now completed Harvoni for 12 weeks.  She is one month post treatment.  Hepatitis A and B immune.  Initial viral load after one monthwas negative. Transaminitis resolved.  F2/3 on elastography.  No issues during treatment.     Review of Systems  Constitutional: Negative for fatigue.  Gastrointestinal: Negative for nausea and diarrhea.  Skin: Negative for rash.  Neurological: Negative for dizziness, light-headedness and headaches.       Objective:   Physical Exam  Constitutional: She appears well-developed and well-nourished. No distress.  HENT:  Mouth/Throat: No oropharyngeal exudate.  Eyes: No scleral icterus.  Cardiovascular: Normal rate, regular rhythm and normal heart sounds.   No murmur heard. Pulmonary/Chest: Effort normal and breath sounds normal. No respiratory distress.  Lymphadenopathy:    She has no cervical adenopathy.  Skin: No rash noted.          Assessment & Plan:

## 2014-11-25 NOTE — Assessment & Plan Note (Signed)
F2-3, will repeat elastography in 1 year.

## 2014-11-25 NOTE — Assessment & Plan Note (Signed)
Completed treatment.  Will check end of treatment lab today.  RTC 3-4 months for SVR12.

## 2014-11-28 ENCOUNTER — Telehealth: Payer: Self-pay | Admitting: *Deleted

## 2014-11-28 NOTE — Telephone Encounter (Signed)
Relayed results to patient. Shearon Clonch M, RN  

## 2014-11-28 NOTE — Telephone Encounter (Signed)
-----   Message from Gardiner Barefoot, MD sent at 11/28/2014  5:02 PM EDT ----- Please let her know her HCV virus remains undetectable.  thanks

## 2014-11-29 LAB — HEPATITIS C RNA QUANTITATIVE: HCV Quantitative: NOT DETECTED IU/mL (ref <15)

## 2015-03-14 ENCOUNTER — Ambulatory Visit: Payer: Managed Care, Other (non HMO) | Admitting: Internal Medicine

## 2015-05-16 ENCOUNTER — Other Ambulatory Visit: Payer: Self-pay | Admitting: Family Medicine

## 2015-05-16 DIAGNOSIS — Z1231 Encounter for screening mammogram for malignant neoplasm of breast: Secondary | ICD-10-CM

## 2015-05-29 ENCOUNTER — Ambulatory Visit (HOSPITAL_COMMUNITY)
Admission: RE | Admit: 2015-05-29 | Discharge: 2015-05-29 | Disposition: A | Discharge status: Home / Self Care | Payer: Managed Care, Other (non HMO) | Source: Ambulatory Visit | Attending: Family Medicine | Admitting: Family Medicine

## 2015-05-29 DIAGNOSIS — Z1231 Encounter for screening mammogram for malignant neoplasm of breast: Secondary | ICD-10-CM | POA: Diagnosis not present

## 2015-06-02 ENCOUNTER — Other Ambulatory Visit: Payer: Self-pay | Admitting: Family Medicine

## 2015-06-02 DIAGNOSIS — R928 Other abnormal and inconclusive findings on diagnostic imaging of breast: Secondary | ICD-10-CM

## 2015-06-08 ENCOUNTER — Other Ambulatory Visit: Payer: Self-pay | Admitting: Family Medicine

## 2015-06-08 DIAGNOSIS — N6320 Unspecified lump in the left breast, unspecified quadrant: Secondary | ICD-10-CM

## 2015-06-20 ENCOUNTER — Ambulatory Visit (HOSPITAL_COMMUNITY)
Admission: RE | Admit: 2015-06-20 | Discharge: 2015-06-20 | Disposition: A | Discharge status: Home / Self Care | Payer: Managed Care, Other (non HMO) | Source: Ambulatory Visit | Attending: Family Medicine | Admitting: Family Medicine

## 2015-06-20 DIAGNOSIS — R928 Other abnormal and inconclusive findings on diagnostic imaging of breast: Secondary | ICD-10-CM | POA: Diagnosis not present

## 2015-06-20 DIAGNOSIS — N63 Unspecified lump in breast: Secondary | ICD-10-CM | POA: Insufficient documentation

## 2015-06-20 DIAGNOSIS — N6320 Unspecified lump in the left breast, unspecified quadrant: Secondary | ICD-10-CM

## 2015-08-30 ENCOUNTER — Encounter: Payer: Managed Care, Other (non HMO) | Admitting: Family Medicine

## 2015-09-13 ENCOUNTER — Encounter: Payer: Self-pay | Admitting: Family Medicine

## 2016-07-08 ENCOUNTER — Ambulatory Visit: Payer: Managed Care, Other (non HMO) | Admitting: Family Medicine

## 2016-07-22 ENCOUNTER — Ambulatory Visit: Payer: Managed Care, Other (non HMO) | Admitting: Family Medicine

## 2017-09-26 ENCOUNTER — Encounter: Payer: Self-pay | Admitting: Family Medicine

## 2017-09-26 ENCOUNTER — Other Ambulatory Visit: Payer: Self-pay

## 2017-09-26 ENCOUNTER — Ambulatory Visit (INDEPENDENT_AMBULATORY_CARE_PROVIDER_SITE_OTHER): Payer: Managed Care, Other (non HMO) | Admitting: Family Medicine

## 2017-09-26 VITALS — BP 138/82 | HR 92 | Temp 98.8°F | Resp 14 | Ht 63.0 in | Wt 169.0 lb

## 2017-09-26 DIAGNOSIS — Z1211 Encounter for screening for malignant neoplasm of colon: Secondary | ICD-10-CM | POA: Diagnosis not present

## 2017-09-26 DIAGNOSIS — Z8619 Personal history of other infectious and parasitic diseases: Secondary | ICD-10-CM

## 2017-09-26 DIAGNOSIS — Z124 Encounter for screening for malignant neoplasm of cervix: Secondary | ICD-10-CM

## 2017-09-26 DIAGNOSIS — Z1231 Encounter for screening mammogram for malignant neoplasm of breast: Secondary | ICD-10-CM | POA: Diagnosis not present

## 2017-09-26 DIAGNOSIS — Z113 Encounter for screening for infections with a predominantly sexual mode of transmission: Secondary | ICD-10-CM

## 2017-09-26 DIAGNOSIS — G4700 Insomnia, unspecified: Secondary | ICD-10-CM | POA: Diagnosis not present

## 2017-09-26 DIAGNOSIS — Z1239 Encounter for other screening for malignant neoplasm of breast: Secondary | ICD-10-CM

## 2017-09-26 DIAGNOSIS — Z0000 Encounter for general adult medical examination without abnormal findings: Secondary | ICD-10-CM | POA: Diagnosis not present

## 2017-09-26 LAB — WET PREP FOR TRICH, YEAST, CLUE

## 2017-09-26 NOTE — Progress Notes (Signed)
Subjective:    Patient ID: Kristin SartoriusCassandra M Ellis, female    DOB: 11/24/1965, 52 y.o.   MRN: [id number]  Patient presents for CPE with PAP (re-establish care- is fasting) Pt here for CPE Last seen Jan 2016 Treated for Hep C back in 2016 with ID, but did not f/u after treatment   her last set of labs however were undetectable Meds and history reviewed  Due for mammogram Due for PAP Smear  Due for colonoscopy  Immunizations- TDAP/HEPA/B uTD, discussed shingles vaccine   Insomnia- does not sleep well, wants to try some OTC first, works 8 hour shifts M-F, no caffiene before bedtime , states she is tired during the day, doesn't want to do much, she is also stressed some about her weight but admits to eating fried chicken and eating out, stopped exercising   Review Of Systems:  GEN- denies fatigue, fever, weight loss,weakness, recent illness HEENT- denies eye drainage, change in vision, nasal discharge, CVS- denies chest pain, palpitations RESP- denies SOB, cough, wheeze ABD- denies N/V, change in stools, abd pain GU- denies dysuria, hematuria, dribbling, incontinence MSK- denies joint pain, muscle aches, injury Neuro- denies headache, dizziness, syncope, seizure activity       Objective:    BP 138/82   Pulse 92   Temp 98.8 F (37.1 C) (Oral)   Resp 14   Ht 5\' 3"  (1.6 m)   Wt 169 lb (76.7 kg)   LMP 01/22/2011   SpO2 100%   BMI 29.94 kg/m  GEN- NAD, alert and oriented x3 HEENT- PERRL, EOMI, non injected sclera, pink conjunctiva, MMM, oropharynx clear Neck- Supple, no thyromegaly Breast- normal symmetry, no nipple inversion,no nipple drainage, no nodules or lumps felt Nodes- no axillary nodes CVS- RRR, no murmur RESP-CTAB ABD-NABS,soft,NT,ND GU- normal external genitalia, vaginal mucosa pink and moist, cervix visualized no growth, no blood form os, minimal thin clear discharge, no CMT, no ovarian masses, uterus normal size EXT- No edema Psych- normal affect and mood   Pulses- Radial, DP- 2+        Assessment & Plan:      Problem List Items Addressed This Visit      Unprioritized   History of hepatitis C    Obtain quant level       Relevant Orders   HCV RNA quant rflx ultra or genotyp([facility])    Other Visit Diagnoses    Routine general medical examination at a health care facility    -  Primary   CPE done, schedule mammogram, shingrix to pharmacy,cologuard testing, fasting labs today, discussed trying melatonin and chamomille/lavender to relax at night   Relevant Orders   CBC with Differential/Platelet   Comprehensive metabolic panel   Lipid panel   HCV RNA quant rflx ultra or genotyp([facility])   Cervical cancer screening       Relevant Orders   Pap IG w/ reflex to HPV when ASC-U   HCV RNA quant rflx ultra or genotyp([facility])   Insomnia, unspecified type       Relevant Orders   HCV RNA quant rflx ultra or genotyp([facility])   Colon cancer screening       Relevant Orders   HCV RNA quant rflx ultra or genotyp([facility])   Screen for STD (sexually transmitted disease)       Relevant Orders   C. trachomatis/N. gonorrhoeae RNA   WET PREP FOR TRICH, YEAST, CLUE (Completed)   HIV antibody   RPR   HSV(herpes simplex vrs) 1+2 ab-IgG   HCV  RNA quant rflx ultra or genotyp([facility])   Breast cancer screening       Relevant Orders   MM 3D SCREEN BREAST BILATERAL   HCV RNA quant rflx ultra or genotyp([facility])      Note: This dictation was prepared with Dragon dictation along with smaller phrase technology. Any transcriptional errors that result from this process are unintentional.

## 2017-09-26 NOTE — Assessment & Plan Note (Signed)
Obtain quant level

## 2017-09-26 NOTE — Patient Instructions (Addendum)
Shingrix sent to pharmacy  Cologuard  Schedule your mammogram  Try the melatonin gummies or bath salts  We will call with lab results  F/U 1 YEAR FOR PHYSICAL

## 2017-09-27 LAB — HCV RNA,QN PCR RFLX GENO, LIPABAD: HCV RNA, PCR, QN (Log): <1.18 log IU/mL

## 2017-09-27 LAB — C. TRACHOMATIS/N. GONORRHOEAE RNA
C. trachomatis RNA, TMA: NOT DETECTED
N. gonorrhoeae RNA, TMA: NOT DETECTED

## 2017-09-29 ENCOUNTER — Encounter (HOSPITAL_COMMUNITY): Payer: Self-pay

## 2017-09-29 ENCOUNTER — Ambulatory Visit (HOSPITAL_COMMUNITY)
Admission: RE | Admit: 2017-09-29 | Discharge: 2017-09-29 | Disposition: A | Discharge status: Home / Self Care | Payer: Self-pay | Source: Ambulatory Visit | Attending: Family Medicine | Admitting: Family Medicine

## 2017-09-29 DIAGNOSIS — Z1231 Encounter for screening mammogram for malignant neoplasm of breast: Secondary | ICD-10-CM | POA: Insufficient documentation

## 2017-09-29 DIAGNOSIS — Z1239 Encounter for other screening for malignant neoplasm of breast: Secondary | ICD-10-CM

## 2017-09-29 LAB — CBC WITH DIFFERENTIAL/PLATELET
BASOS PCT: 0.3 %
Basophils Absolute: 10 cells/uL (ref 0–200)
Eosinophils Absolute: 20 cells/uL (ref 15–500)
Eosinophils Relative: 0.6 %
HEMATOCRIT: 38.7 % (ref 35.0–45.0)
Hemoglobin: 13.5 g/dL (ref 11.7–15.5)
LYMPHS ABS: 1248 {cells}/uL (ref 850–3900)
MCH: 29.5 pg (ref 27.0–33.0)
MCHC: 34.9 g/dL (ref 32.0–36.0)
MCV: 84.7 fL (ref 80.0–100.0)
MPV: 9.8 fL (ref 7.5–12.5)
Monocytes Relative: 6.9 %
Neutro Abs: 1887 cells/uL (ref 1500–7800)
Neutrophils Relative %: 55.5 %
Platelets: 184 10*3/uL (ref 140–400)
RBC: 4.57 10*6/uL (ref 3.80–5.10)
RDW: 12.8 % (ref 11.0–15.0)
Total Lymphocyte: 36.7 %
WBC: 3.4 10*3/uL — ABNORMAL LOW (ref 3.8–10.8)
WBCMIX: 235 {cells}/uL (ref 200–950)

## 2017-09-29 LAB — PAP IG W/ RFLX HPV ASCU

## 2017-09-29 LAB — COMPREHENSIVE METABOLIC PANEL
AG RATIO: 1.5 (calc) (ref 1.0–2.5)
ALKALINE PHOSPHATASE (APISO): 94 U/L (ref 33–130)
ALT: 17 U/L (ref 6–29)
AST: 17 U/L (ref 10–35)
Albumin: 4.5 g/dL (ref 3.6–5.1)
BUN: 11 mg/dL (ref 7–25)
CALCIUM: 9.1 mg/dL (ref 8.6–10.4)
CO2: 29 mmol/L (ref 20–32)
Chloride: 105 mmol/L (ref 98–110)
Creat: 0.92 mg/dL (ref 0.50–1.05)
GLOBULIN: 3 g/dL (ref 1.9–3.7)
GLUCOSE: 86 mg/dL (ref 65–99)
Potassium: 3.9 mmol/L (ref 3.5–5.3)
Sodium: 141 mmol/L (ref 135–146)
Total Bilirubin: 0.3 mg/dL (ref 0.2–1.2)
Total Protein: 7.5 g/dL (ref 6.1–8.1)

## 2017-09-29 LAB — HSV(HERPES SIMPLEX VRS) I + II AB-IGG
HSV 1 IGG, TYPE SPEC: 20.8 {index} — AB
HSV 2 IGG,TYPE SPECIFIC AB: 5.89 index — ABNORMAL HIGH

## 2017-09-29 LAB — HIV ANTIBODY (ROUTINE TESTING W REFLEX): HIV 1&2 Ab, 4th Generation: NONREACTIVE

## 2017-09-29 LAB — RPR: RPR Ser Ql: NONREACTIVE

## 2017-09-29 LAB — LIPID PANEL
Cholesterol: 151 mg/dL (ref <200)
HDL: 45 mg/dL — AB (ref >50)
LDL Cholesterol (Calc): 92 mg/dL (calc)
Non-HDL Cholesterol (Calc): 106 mg/dL (calc) (ref <130)
TRIGLYCERIDES: 58 mg/dL (ref <150)
Total CHOL/HDL Ratio: 3.4 (calc) (ref <5.0)

## 2017-10-01 ENCOUNTER — Other Ambulatory Visit: Payer: Self-pay | Admitting: *Deleted

## 2017-10-01 MED ORDER — METRONIDAZOLE 500 MG PO TABS: 500.0000 mg | ORAL_TABLET | Freq: Two times a day (BID) | ORAL | 0 refills | Status: DC

## 2017-10-01 MED ORDER — VALACYCLOVIR HCL 500 MG PO TABS: 500.0000 mg | ORAL_TABLET | Freq: Every day | ORAL | 3 refills | Status: DC

## 2017-10-21 ENCOUNTER — Telehealth: Payer: Self-pay | Admitting: *Deleted

## 2017-10-21 NOTE — Telephone Encounter (Signed)
Received call from patient.   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 Inquired as to if she can drink alcohol while on Valtrex. MD made aware and recommendations are as follows: Drink in moderation Do not take medication with alcohol, or with in 2 hours of medication as it can increase nausea/ drowsiness  Also inquired as to HSV transmittal. Re-educated on fluid to fluid transfer.

## 2017-11-10 ENCOUNTER — Telehealth: Payer: Self-pay | Admitting: *Deleted

## 2017-11-10 MED ORDER — FLUCONAZOLE 150 MG PO TABS: 150.0000 mg | ORAL_TABLET | Freq: Once | ORAL | 0 refills | Status: AC

## 2017-11-10 NOTE — Telephone Encounter (Signed)
Received call from patient.   Reports that she has thick discharge and vaginal itching. Requested order for Diflucan.   Prescription sent to pharmacy for Diflucan. Advised that if S/Sx do not resolve after dosage, OV will be required.

## 2017-11-10 NOTE — Telephone Encounter (Signed)
Agree with above 

## 2018-01-23 ENCOUNTER — Other Ambulatory Visit: Payer: Self-pay | Admitting: Family Medicine

## 2018-05-28 ENCOUNTER — Other Ambulatory Visit: Payer: Self-pay | Admitting: Family Medicine

## 2018-08-03 ENCOUNTER — Telehealth: Payer: Self-pay | Admitting: *Deleted

## 2018-08-03 MED ORDER — FLUCONAZOLE 150 MG PO TABS: 150.0000 mg | ORAL_TABLET | Freq: Once | ORAL | 0 refills | Status: AC

## 2018-08-03 NOTE — Telephone Encounter (Signed)
Received call from patient.   Reports that she has slight discharge that is white and itching in vaginal canal. Requested Diflucan.   Prescription sent to pharmacy for Diflucan. Advised that if S/Sx do not resolve after dosage, OV will be required.   Verbalized understanding.

## 2018-08-14 ENCOUNTER — Ambulatory Visit (INDEPENDENT_AMBULATORY_CARE_PROVIDER_SITE_OTHER): Payer: Managed Care, Other (non HMO) | Admitting: Family Medicine

## 2018-08-14 ENCOUNTER — Other Ambulatory Visit: Payer: Self-pay

## 2018-08-14 ENCOUNTER — Ambulatory Visit: Payer: Self-pay | Admitting: Family Medicine

## 2018-08-14 ENCOUNTER — Encounter: Payer: Self-pay | Admitting: Family Medicine

## 2018-08-14 VITALS — BP 140/82 | HR 67 | Temp 98.4°F | Resp 18 | Ht 63.0 in | Wt 182.2 lb

## 2018-08-14 DIAGNOSIS — N760 Acute vaginitis: Secondary | ICD-10-CM

## 2018-08-14 DIAGNOSIS — R300 Dysuria: Secondary | ICD-10-CM

## 2018-08-14 LAB — URINALYSIS, ROUTINE W REFLEX MICROSCOPIC
Bilirubin Urine: NEGATIVE
Glucose, UA: NEGATIVE
Hgb urine dipstick: NEGATIVE
Ketones, ur: NEGATIVE
Leukocytes,Ua: NEGATIVE
Nitrite: NEGATIVE
Protein, ur: NEGATIVE
Specific Gravity, Urine: 1.02 (ref 1.001–1.03)
pH: 7 (ref 5.0–8.0)

## 2018-08-14 LAB — WET PREP FOR TRICH, YEAST, CLUE

## 2018-08-14 MED ORDER — FLUCONAZOLE 150 MG PO TABS: 150.0000 mg | ORAL_TABLET | ORAL | 0 refills | Status: DC | PRN

## 2018-08-14 MED ORDER — METRONIDAZOLE 500 MG PO TABS: 500.0000 mg | ORAL_TABLET | Freq: Two times a day (BID) | ORAL | 0 refills | Status: AC

## 2018-08-14 NOTE — Patient Instructions (Signed)

## 2018-08-14 NOTE — Progress Notes (Signed)
Patient ID: [name], female    DOB: Feb 22, 1966, 53 y.o.   MRN: [id number]  PCP: Salley Scarlet, MD  Chief Complaint  Patient presents with  . Vaginitis    irritation    Subjective:   [name] is a 53 y.o. female, presents to clinic with CC of vaginal irritation and dysuria 3 weeks ago.    Vaginal Itching  The patient's primary symptoms include genital itching and vaginal discharge. The patient's pertinent negatives include no genital lesions, genital odor, genital rash, missed menses, pelvic pain or vaginal bleeding. This is a new problem. The current episode started in the past 7 days. The problem occurs constantly. The problem has been gradually worsening. The pain is mild. The problem affects both sides. She is not pregnant. Associated symptoms include dysuria, frequency and urgency. Pertinent negatives include no abdominal pain, anorexia, back pain, chills, constipation, diarrhea, discolored urine, fever, flank pain, headaches, hematuria, joint pain, joint swelling, nausea, painful intercourse, rash, sore throat or vomiting. The vaginal discharge was white. There has been no bleeding. She has not been passing clots. She has not been passing tissue. Nothing aggravates the symptoms. She has tried nothing for the symptoms. The treatment provided no relief. Her sexual activity is non-contributory to the current illness. No, her partner does not have an STD. She is postmenopausal.  Dysuria   This is a new problem. The current episode started 1 to 4 weeks ago (3 weeks ago). The problem occurs every urination. The problem has been unchanged. The quality of the pain is described as burning. There has been no fever. Associated symptoms include a discharge, frequency and urgency. Pertinent negatives include no chills, flank pain, hematuria, hesitancy, nausea, possible pregnancy, sweats or vomiting. She has tried increased fluids (cranberry juice) for the symptoms. The  treatment provided moderate relief.      Patient Active Problem List   Diagnosis Date Noted  . History of hepatitis C 09/26/2017  . Liver fibrosis 11/25/2014  . Chronic hepatitis C virus infection (HCC) 06/29/2009     Prior to Admission medications   Medication Sig Start Date End Date Taking? Authorizing Provider  valACYclovir (VALTREX) 500 MG tablet TAKE 1 TABLET BY MOUTH EVERY DAY 05/28/18  Yes [place], Velna Hatchet, MD     No Known Allergies   Family History  Problem Relation Age of Onset  . Hypertension Sister   . Depression Sister   . Hypertension Brother   . Depression Brother   . Hypertension Brother   . Hypertension Sister   . Depression Sister   . Colon cancer Neg Hx      Social History   Socioeconomic History  . Marital status: Divorced    Spouse name: Not on file  . Number of children: Not on file  . Years of education: Not on file  . Highest education level: Not on file  Occupational History  . Occupation: Electrical engineer: [employer]    Comment: [place]  Social Needs  . Financial resource strain: Not on file  . Food insecurity:    Worry: Not on file    Inability: Not on file  . Transportation needs:    Medical: Not on file    Non-medical: Not on file  Tobacco Use  . Smoking status: Never Smoker  . Smokeless tobacco: Never Used  Substance and Sexual Activity  . Alcohol use: Yes    Alcohol/week: 4.0 standard drinks  Types: 2 Cans of beer, 2 Shots of liquor per week    Comment: socially  . Drug use: No  . Sexual activity: Yes  Lifestyle  . Physical activity:    Days per week: Not on file    Minutes per session: Not on file  . Stress: Not on file  Relationships  . Social connections:    Talks on phone: Not on file    Gets together: Not on file    Attends religious service: Not on file    Active member of club or organization: Not on file    Attends meetings of clubs or organizations: Not on file     Relationship status: Not on file  . Intimate partner violence:    Fear of current or ex partner: Not on file    Emotionally abused: Not on file    Physically abused: Not on file    Forced sexual activity: Not on file  Other Topics Concern  . Not on file  Social History Narrative  . Not on file     Review of Systems  Constitutional: Negative.  Negative for chills and fever.  HENT: Negative.  Negative for sore throat.   Eyes: Negative.   Respiratory: Negative.   Cardiovascular: Negative.   Gastrointestinal: Negative.  Negative for abdominal pain, anorexia, constipation, diarrhea, nausea and vomiting.  Endocrine: Negative.   Genitourinary: Positive for dysuria, frequency, urgency and vaginal discharge. Negative for flank pain, hematuria, hesitancy, missed menses and pelvic pain.  Musculoskeletal: Negative.  Negative for back pain and joint pain.  Skin: Negative.  Negative for rash.  Allergic/Immunologic: Negative.   Neurological: Negative.  Negative for headaches.  Hematological: Negative.   Psychiatric/Behavioral: Negative.   All other systems reviewed and are negative.      Objective:    Vitals:   08/14/18 1024  BP: 140/82  Pulse: 67  Resp: 18  Temp: 98.4 F (36.9 C)  SpO2: 99%  Weight: 182 lb 3.2 oz (82.6 kg)  Height: 5\' 3"  (1.6 m)      Physical Exam Vitals signs and nursing note reviewed.  Constitutional:      General: She is not in acute distress.    Appearance: She is well-developed. She is obese. She is not ill-appearing, toxic-appearing or diaphoretic.  HENT:     Head: Normocephalic and atraumatic.     Nose: Nose normal.  Eyes:     General:        Right eye: No discharge.        Left eye: No discharge.     Conjunctiva/sclera: Conjunctivae normal.  Neck:     Trachea: No tracheal deviation.  Cardiovascular:     Rate and Rhythm: Normal rate and regular rhythm.  Pulmonary:     Effort: Pulmonary effort is normal. No respiratory distress.     Breath  sounds: No stridor.  Abdominal:     General: Bowel sounds are normal.     Palpations: Abdomen is soft.     Tenderness: There is no abdominal tenderness. There is no right CVA tenderness, left CVA tenderness, guarding or rebound.  Genitourinary:    Comments: Vulva mildly erythematous and mildly edematous, no rash, no lesions Profuse white discharge from vagina with thicker white chunks Musculoskeletal: Normal range of motion.  Skin:    General: Skin is warm and dry.     Findings: No rash.  Neurological:     Mental Status: She is alert.     Motor: No abnormal muscle  tone.     Coordination: Coordination normal.  Psychiatric:        Behavior: Behavior normal.      Results for orders placed or performed in visit on 08/14/18  WET PREP FOR TRICH, YEAST, CLUE  Result Value Ref Range   Source: VAGINA    RESULT     Microscopic wet-mount exam shows KOH done, clue cells.     Assessment & Plan:      ICD-10-CM   1. Acute vaginitis N76.0 WET PREP FOR TRICH, YEAST, CLUE  2. Dysuria R30.0 Urinalysis, Routine w reflex microscopic    Urine Culture    UA pending Wet prep positive for clue cells - will treat for BV with flagyl, diflucan given to take PRN for any continued vaginal itching.  Pt also encouraged to try OTC topical anti-yeast meds for itching   Danelle Berry, PA-C 08/14/18 10:40 AM

## 2018-08-16 LAB — URINE CULTURE
MICRO NUMBER:: 420072
SPECIMEN QUALITY:: ADEQUATE

## 2018-09-23 ENCOUNTER — Other Ambulatory Visit (HOSPITAL_COMMUNITY): Payer: Self-pay | Admitting: Family Medicine

## 2018-09-23 DIAGNOSIS — Z1231 Encounter for screening mammogram for malignant neoplasm of breast: Secondary | ICD-10-CM

## 2018-09-27 ENCOUNTER — Other Ambulatory Visit: Payer: Self-pay | Admitting: Family Medicine

## 2018-10-02 ENCOUNTER — Other Ambulatory Visit: Payer: Self-pay

## 2018-10-02 ENCOUNTER — Ambulatory Visit (INDEPENDENT_AMBULATORY_CARE_PROVIDER_SITE_OTHER): Payer: Managed Care, Other (non HMO) | Admitting: Family Medicine

## 2018-10-02 ENCOUNTER — Encounter: Payer: Self-pay | Admitting: Family Medicine

## 2018-10-02 VITALS — BP 122/70 | HR 76 | Temp 98.8°F | Resp 18 | Ht 63.0 in | Wt 179.0 lb

## 2018-10-02 DIAGNOSIS — Z6831 Body mass index (BMI) 31.0-31.9, adult: Secondary | ICD-10-CM

## 2018-10-02 DIAGNOSIS — E6609 Other obesity due to excess calories: Secondary | ICD-10-CM

## 2018-10-02 DIAGNOSIS — Z0001 Encounter for general adult medical examination with abnormal findings: Secondary | ICD-10-CM

## 2018-10-02 DIAGNOSIS — E669 Obesity, unspecified: Secondary | ICD-10-CM | POA: Insufficient documentation

## 2018-10-02 DIAGNOSIS — Z0000 Encounter for general adult medical examination without abnormal findings: Secondary | ICD-10-CM

## 2018-10-02 DIAGNOSIS — Z1211 Encounter for screening for malignant neoplasm of colon: Secondary | ICD-10-CM

## 2018-10-02 NOTE — Progress Notes (Signed)
   Subjective:    Patient ID: [name], female    DOB: 1965/12/01, 53 y.o.   MRN: [id number]  Patient presents for Annual Exam   Pt here for CPE, medications reviewed Due for fasting labs   Mammogram -scheduled already PAP Smear UTD  Immunizations- TDAP UTD   Due for colonoscopy   Takes Biotin hair and Nail No new concerns. States she was up in 180's has lost a few pounds   Review Of Systems:  GEN- denies fatigue, fever, weight loss,weakness, recent illness HEENT- denies eye drainage, change in vision, nasal discharge, CVS- denies chest pain, palpitations RESP- denies SOB, cough, wheeze ABD- denies N/V, change in stools, abd pain GU- denies dysuria, hematuria, dribbling, incontinence MSK- denies joint pain, muscle aches, injury Neuro- denies headache, dizziness, syncope, seizure activity       Objective:    BP 122/70   Pulse 76   Temp 98.8 F (37.1 C)   Resp 18   Ht 5\' 3"  (1.6 m)   Wt 179 lb (81.2 kg)   LMP 01/22/2011   SpO2 96%   BMI 31.71 kg/m  GEN- NAD, alert and oriented x3 HEENT- PERRL, EOMI, non injected sclera, pink conjunctiva, MMM, oropharynx clear, Tm clear bilat no effusion  Neck- Supple, no thyromegaly CVS- RRR, no murmur RESP-CTAB ABD-NABS,soft,NT,ND EXT- No edema Pulses- Radial, DP- 2+    Audit C/ Depression screening/fall screen negative    Assessment & Plan:      Problem List Items Addressed This Visit      Unprioritized   Obesity    Continue working on dietary changes for weight loss       Other Visit Diagnoses    Routine general medical examination at a health care facility    -  Primary   CPE done, fasting labs obtained, discussed shingrix she will review handout, call if she wants shot, cologuard   Relevant Orders   CBC with Differential/Platelet   Comprehensive metabolic panel   Lipid panel   Colon cancer screening          Note: This dictation was prepared with Dragon dictation along with smaller phrase  technology. Any transcriptional errors that result from this process are unintentional.

## 2018-10-02 NOTE — Assessment & Plan Note (Signed)
Continue working on dietary changes for weight loss

## 2018-10-02 NOTE — Patient Instructions (Addendum)
I recommend eye visit once a year I recommend dental visit every 6 months Goal is to  Exercise 30 minutes 5 days a week We will call with lab results  Colo guard to be set up  Review shingrix information  F/U 1 year for PHYSICAL

## 2018-10-03 LAB — COMPREHENSIVE METABOLIC PANEL
AG Ratio: 1.6 (calc) (ref 1.0–2.5)
ALT: 16 U/L (ref 6–29)
AST: 21 U/L (ref 10–35)
Albumin: 4.6 g/dL (ref 3.6–5.1)
Alkaline phosphatase (APISO): 70 U/L (ref 37–153)
BUN: 13 mg/dL (ref 7–25)
CO2: 26 mmol/L (ref 20–32)
Calcium: 9.2 mg/dL (ref 8.6–10.4)
Chloride: 107 mmol/L (ref 98–110)
Creat: 0.89 mg/dL (ref 0.50–1.05)
Globulin: 2.9 g/dL (calc) (ref 1.9–3.7)
Glucose, Bld: 86 mg/dL (ref 65–99)
Potassium: 4.2 mmol/L (ref 3.5–5.3)
Sodium: 142 mmol/L (ref 135–146)
Total Bilirubin: 0.4 mg/dL (ref 0.2–1.2)
Total Protein: 7.5 g/dL (ref 6.1–8.1)

## 2018-10-03 LAB — CBC WITH DIFFERENTIAL/PLATELET
Absolute Monocytes: 410 cells/uL (ref 200–950)
Basophils Absolute: 19 cells/uL (ref 0–200)
Basophils Relative: 0.3 %
Eosinophils Absolute: 13 cells/uL — ABNORMAL LOW (ref 15–500)
Eosinophils Relative: 0.2 %
HCT: 38.8 % (ref 35.0–45.0)
Hemoglobin: 13.2 g/dL (ref 11.7–15.5)
Lymphs Abs: 1657 cells/uL (ref 850–3900)
MCH: 30.5 pg (ref 27.0–33.0)
MCHC: 34 g/dL (ref 32.0–36.0)
MCV: 89.6 fL (ref 80.0–100.0)
MPV: 9.8 fL (ref 7.5–12.5)
Monocytes Relative: 6.5 %
Neutro Abs: 4202 cells/uL (ref 1500–7800)
Neutrophils Relative %: 66.7 %
Platelets: 198 10*3/uL (ref 140–400)
RBC: 4.33 10*6/uL (ref 3.80–5.10)
RDW: 12.5 % (ref 11.0–15.0)
Total Lymphocyte: 26.3 %
WBC: 6.3 10*3/uL (ref 3.8–10.8)

## 2018-10-03 LAB — LIPID PANEL
Cholesterol: 170 mg/dL (ref <200)
HDL: 51 mg/dL (ref >=50)
LDL Cholesterol (Calc): 103 mg/dL (calc) — ABNORMAL HIGH
Non-HDL Cholesterol (Calc): 119 mg/dL (calc) (ref <130)
Total CHOL/HDL Ratio: 3.3 (calc) (ref <5.0)
Triglycerides: 69 mg/dL (ref <150)

## 2018-10-05 ENCOUNTER — Ambulatory Visit (HOSPITAL_COMMUNITY)
Admission: RE | Admit: 2018-10-05 | Discharge: 2018-10-05 | Disposition: A | Discharge status: Home / Self Care | Payer: 59 | Source: Ambulatory Visit | Attending: Family Medicine | Admitting: Family Medicine

## 2018-10-05 ENCOUNTER — Other Ambulatory Visit: Payer: Self-pay

## 2018-10-05 ENCOUNTER — Telehealth: Payer: Self-pay | Admitting: *Deleted

## 2018-10-05 DIAGNOSIS — Z1231 Encounter for screening mammogram for malignant neoplasm of breast: Secondary | ICD-10-CM

## 2018-10-05 NOTE — Telephone Encounter (Signed)
Received verbal orders for Cologuard.   Order placed via [facility].   Cologuard (Order [id number])

## 2018-10-05 NOTE — Telephone Encounter (Signed)
-----   Message from Alycia Rossetti, MD sent at 10/02/2018  2:12 PM EDT ----- Regarding: send cologuard

## 2018-10-06 ENCOUNTER — Encounter: Payer: Self-pay | Admitting: *Deleted

## 2019-01-28 ENCOUNTER — Other Ambulatory Visit: Payer: Self-pay | Admitting: Family Medicine

## 2019-06-02 ENCOUNTER — Other Ambulatory Visit: Payer: Self-pay | Admitting: Family Medicine

## 2019-09-03 ENCOUNTER — Other Ambulatory Visit (HOSPITAL_COMMUNITY): Payer: Self-pay | Admitting: Family Medicine

## 2019-09-03 DIAGNOSIS — Z1231 Encounter for screening mammogram for malignant neoplasm of breast: Secondary | ICD-10-CM

## 2019-10-05 ENCOUNTER — Encounter: Payer: Self-pay | Admitting: Family Medicine

## 2019-10-15 ENCOUNTER — Other Ambulatory Visit: Payer: Self-pay

## 2019-10-15 ENCOUNTER — Ambulatory Visit (HOSPITAL_COMMUNITY)
Admission: RE | Admit: 2019-10-15 | Discharge: 2019-10-15 | Disposition: A | Discharge status: Home / Self Care | Payer: 59 | Source: Ambulatory Visit | Attending: Family Medicine | Admitting: Family Medicine

## 2019-10-15 DIAGNOSIS — Z1231 Encounter for screening mammogram for malignant neoplasm of breast: Secondary | ICD-10-CM | POA: Insufficient documentation

## 2019-11-10 ENCOUNTER — Other Ambulatory Visit: Payer: Self-pay | Admitting: Family Medicine

## 2019-12-05 ENCOUNTER — Other Ambulatory Visit: Payer: Self-pay | Admitting: Family Medicine

## 2019-12-07 NOTE — Telephone Encounter (Signed)
Last OV 10/02/2018 Last refill 11/10/2019  Ok to refill?

## 2020-01-26 ENCOUNTER — Encounter: Payer: Self-pay | Admitting: Family Medicine

## 2020-01-26 ENCOUNTER — Ambulatory Visit (INDEPENDENT_AMBULATORY_CARE_PROVIDER_SITE_OTHER): Payer: PRIVATE HEALTH INSURANCE | Admitting: Family Medicine

## 2020-01-26 ENCOUNTER — Other Ambulatory Visit: Payer: Self-pay

## 2020-01-26 VITALS — BP 124/68 | HR 72 | Temp 98.1°F | Resp 14 | Ht 63.0 in | Wt 180.0 lb

## 2020-01-26 DIAGNOSIS — E6609 Other obesity due to excess calories: Secondary | ICD-10-CM

## 2020-01-26 DIAGNOSIS — G4700 Insomnia, unspecified: Secondary | ICD-10-CM

## 2020-01-26 DIAGNOSIS — Z6831 Body mass index (BMI) 31.0-31.9, adult: Secondary | ICD-10-CM | POA: Diagnosis not present

## 2020-01-26 DIAGNOSIS — Z0001 Encounter for general adult medical examination with abnormal findings: Secondary | ICD-10-CM

## 2020-01-26 DIAGNOSIS — Z0000 Encounter for general adult medical examination without abnormal findings: Secondary | ICD-10-CM

## 2020-01-26 NOTE — Progress Notes (Signed)
Subjective:    Patient ID: [name], female    DOB: [birth year], 54 y.o.   MRN: [id number]  Patient presents for Annual Exam (is fasting)   Pt here for CPE, medications reviewed Due for fasting labs    Has difficulty staying sleep.  States that she typically is on her phone when she falls asleep otherwise she cannot go to sleep.  She has to keep her phone with her or she will wake up.  Often wakes up in the middle the night and they cannot go back to sleep.  Mammogram -UTD  PAP Smear UTD  Immunizations- TDAP UTD   Due for colonoscopy     Due for colon cancer screen    She cant seem to lose below 180lbs  Typically eat fruit first thing morning   8:15 - eats 2 boiled eggs and banana   11:30am- baked chicken, veggie or what left over     5pm-she cooks more during the week     Doesn't snack much     Drinks mostly green tea and water     She has cut out soda     She has not been exercising was previously walking and that helped her rest better.      Review Of Systems:  GEN- denies fatigue, fever, weight loss,weakness, recent illness HEENT- denies eye drainage, change in vision, nasal discharge, CVS- denies chest pain, palpitations RESP- denies SOB, cough, wheeze ABD- denies N/V, change in stools, abd pain GU- denies dysuria, hematuria, dribbling, incontinence MSK- denies joint pain, muscle aches, injury Neuro- denies headache, dizziness, syncope, seizure activity       Objective:    BP 124/68   Pulse 72   Temp 98.1 F (36.7 C) (Temporal)   Resp 14   Ht 5\' 3"  (1.6 m)   Wt 180 lb (81.6 kg)   LMP 01/22/2011   SpO2 98%   BMI 31.89 kg/m  GEN- NAD, alert and oriented x3 HEENT- PERRL, EOMI, non injected sclera, pink conjunctiva, MMM, oropharynx clear Neck- Supple, no thyromegaly CVS- RRR, no murmur RESP-CTAB ABD-NABS,soft,NT,ND Psych normal affect and mood EXT- No edema Pulses- Radial, DP- 2+        Assessment & Plan:      Problem  List Items Addressed This Visit      Unprioritized   Obesity-discussed increasing her protein.  Avoiding processed food and fast foods.   Relevant Orders   TSH    Other Visit Diagnoses    Routine general medical examination at a health care facility    -  Primary   CPE done.  Fasting labs obtained. Cologuard for colon cancer screen    Relevant Orders   CBC with Differential/Platelet   Comprehensive metabolic panel   Lipid panel   TSH   Insomnia, unspecified type       She declines any medications.  I think we can start with interventions for good sleep hygiene Discussed using the calm app for another meditation app at bedtime to help her get to rest.  Decrease the bluelight in general.  Also recommend she increase her physical activity as this can help with sleep.  She is also concerned her weight is maintained at 180 so we discussed physical activity can also assist with her weight loss attempt.  Melatonin can be used as the next step..      Note: This dictation was prepared with Dragon dictation along with smaller phrase technology. Any transcriptional errors that result  from this process are unintentional.

## 2020-01-26 NOTE — Patient Instructions (Addendum)
Try meditation APP/CALM APP Melatonin 3mg  at bedtime for sleep  Add exercise  Cologuard to be done  F/U 1 year for physical

## 2020-01-27 LAB — COMPREHENSIVE METABOLIC PANEL
AG Ratio: 1.5 (calc) (ref 1.0–2.5)
ALT: 16 U/L (ref 6–29)
AST: 15 U/L (ref 10–35)
Albumin: 4.5 g/dL (ref 3.6–5.1)
Alkaline phosphatase (APISO): 73 U/L (ref 37–153)
BUN: 14 mg/dL (ref 7–25)
CO2: 25 mmol/L (ref 20–32)
Calcium: 9.8 mg/dL (ref 8.6–10.4)
Chloride: 104 mmol/L (ref 98–110)
Creat: 0.88 mg/dL (ref 0.50–1.05)
Globulin: 3.1 g/dL (calc) (ref 1.9–3.7)
Glucose, Bld: 88 mg/dL (ref 65–99)
Potassium: 4 mmol/L (ref 3.5–5.3)
Sodium: 140 mmol/L (ref 135–146)
Total Bilirubin: 0.5 mg/dL (ref 0.2–1.2)
Total Protein: 7.6 g/dL (ref 6.1–8.1)

## 2020-01-27 LAB — CBC WITH DIFFERENTIAL/PLATELET
Absolute Monocytes: 400 cells/uL (ref 200–950)
Basophils Absolute: 28 cells/uL (ref 0–200)
Basophils Relative: 0.6 %
Eosinophils Absolute: 38 cells/uL (ref 15–500)
Eosinophils Relative: 0.8 %
HCT: 40 % (ref 35.0–45.0)
Hemoglobin: 13.6 g/dL (ref 11.7–15.5)
Lymphs Abs: 1969 cells/uL (ref 850–3900)
MCH: 30.4 pg (ref 27.0–33.0)
MCHC: 34 g/dL (ref 32.0–36.0)
MCV: 89.3 fL (ref 80.0–100.0)
MPV: 9.9 fL (ref 7.5–12.5)
Monocytes Relative: 8.5 %
Neutro Abs: 2265 cells/uL (ref 1500–7800)
Neutrophils Relative %: 48.2 %
Platelets: 190 10*3/uL (ref 140–400)
RBC: 4.48 10*6/uL (ref 3.80–5.10)
RDW: 12.8 % (ref 11.0–15.0)
Total Lymphocyte: 41.9 %
WBC: 4.7 10*3/uL (ref 3.8–10.8)

## 2020-01-27 LAB — LIPID PANEL
Cholesterol: 201 mg/dL — ABNORMAL HIGH (ref <200)
HDL: 52 mg/dL (ref >=50)
LDL Cholesterol (Calc): 129 mg/dL (calc) — ABNORMAL HIGH
Non-HDL Cholesterol (Calc): 149 mg/dL (calc) — ABNORMAL HIGH (ref <130)
Total CHOL/HDL Ratio: 3.9 (calc) (ref <5.0)
Triglycerides: 95 mg/dL (ref <150)

## 2020-01-27 LAB — TSH: TSH: 1.05 mIU/L (ref 0.40–4.50)

## 2020-01-28 ENCOUNTER — Encounter: Payer: Self-pay | Admitting: *Deleted

## 2020-01-28 ENCOUNTER — Telehealth: Payer: Self-pay | Admitting: *Deleted

## 2020-01-28 NOTE — Telephone Encounter (Signed)
-----   Message from Salley Scarlet, MD sent at 01/26/2020  1:53 PM EDT ----- Regarding: Send cologuard

## 2020-02-02 NOTE — Telephone Encounter (Signed)
Received verbal orders for Cologuard.   Order placed via [employer].   Cologuard (Order [phone])

## 2020-03-03 ENCOUNTER — Other Ambulatory Visit: Payer: Self-pay | Admitting: Family Medicine

## 2020-05-11 ENCOUNTER — Other Ambulatory Visit: Payer: 59

## 2020-05-11 ENCOUNTER — Other Ambulatory Visit: Payer: Self-pay

## 2020-05-11 DIAGNOSIS — Z20822 Contact with and (suspected) exposure to covid-19: Secondary | ICD-10-CM

## 2020-05-13 LAB — SARS-COV-2, NAA 2 DAY TAT

## 2020-05-13 LAB — NOVEL CORONAVIRUS, NAA: SARS-CoV-2, NAA: NOT DETECTED

## 2020-07-17 ENCOUNTER — Ambulatory Visit (INDEPENDENT_AMBULATORY_CARE_PROVIDER_SITE_OTHER): Payer: 59 | Admitting: Internal Medicine

## 2020-07-17 ENCOUNTER — Other Ambulatory Visit: Payer: Self-pay

## 2020-07-17 ENCOUNTER — Encounter: Payer: Self-pay | Admitting: Internal Medicine

## 2020-07-17 VITALS — BP 163/98 | HR 80 | Resp 18 | Ht 63.0 in | Wt 181.1 lb

## 2020-07-17 DIAGNOSIS — E6609 Other obesity due to excess calories: Secondary | ICD-10-CM | POA: Diagnosis not present

## 2020-07-17 DIAGNOSIS — I10 Essential (primary) hypertension: Secondary | ICD-10-CM

## 2020-07-17 DIAGNOSIS — Z8619 Personal history of other infectious and parasitic diseases: Secondary | ICD-10-CM | POA: Diagnosis not present

## 2020-07-17 DIAGNOSIS — Z1211 Encounter for screening for malignant neoplasm of colon: Secondary | ICD-10-CM

## 2020-07-17 DIAGNOSIS — Z7689 Persons encountering health services in other specified circumstances: Secondary | ICD-10-CM

## 2020-07-17 HISTORY — DX: Persons encountering health services in other specified circumstances: Z76.89

## 2020-07-17 MED ORDER — AMLODIPINE BESYLATE 5 MG PO TABS: 5.0000 mg | ORAL_TABLET | Freq: Every day | ORAL | 1 refills | Status: DC

## 2020-07-17 NOTE — Assessment & Plan Note (Addendum)
Care established History and medications reviewed with the patient 

## 2020-07-17 NOTE — Progress Notes (Signed)
New Patient Office Visit  Subjective:  Patient ID: [name], female    DOB: February 19, 1966  Age: 55 y.o. MRN: [id number]  CC:  Chief Complaint  Patient presents with  . New Patient (Initial Visit)    New patient just establishing care     HPI [name] is a 55 year old female with PMH of Hepatitis C (treated in 2016) and obesity who presents for establishing care. She is a former patient of Dr. Jeanice Lim.  Her BP was elevated in the office today on multiple measurements. She states that she significant FH of HTN. She denies any headache, dizziness, chest pain, dyspnea or palpitations.  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  She has not received cologuard yet that was ordered by previous PCP.  She has had 2 doses of COVID vaccine.    Past Medical History:  Diagnosis Date  . 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 Hepatitis C antibody test positive    HCV RNA load 1960    Past Surgical History:  Procedure Laterality Date  . LIVER BIOPSY  April 2011   Likely HCV, ?autoimmune hepatitis, positive ANA, weak positive ANA titer, referred to [facility], lost to follow-up  . TUBAL LIGATION      Family History  Problem Relation Age of Onset  . Hypertension Sister   . Depression Sister   . Hypertension Brother   . Depression Brother   . Hypertension Brother   . Hypertension Sister   . Depression Sister   . Colon cancer Neg Hx     Social History   Socioeconomic History  . Marital status: Divorced    Spouse name: Not on file  . Number of children: Not on file  . Years of education: Not on file  . Highest education level: Not on file  Occupational History  . Occupation: Electrical engineer: [employer]    Comment: [place]  Tobacco Use  . Smoking status: Never Smoker  . Smokeless tobacco: Never Used  Substance and Sexual Activity  . Alcohol use: Yes    Alcohol/week: 4.0 standard  drinks    Types: 2 Cans of beer, 2 Shots of liquor per week    Comment: socially  . Drug use: No  . Sexual activity: Yes  Other Topics Concern  . Not on file  Social History Narrative  . Not on file   Social Determinants of Health   Financial Resource Strain: Not on file  Food Insecurity: Not on file  Transportation Needs: Not on file  Physical Activity: Not on file  Stress: Not on file  Social Connections: Not on file  Intimate Partner Violence: Not on file    ROS Review of Systems  Constitutional: Negative for chills and fever.  HENT: Negative for congestion, sinus pressure, sinus pain and sore throat.   Eyes: Negative for pain and discharge.  Respiratory: Negative for cough and shortness of breath.   Cardiovascular: Negative for chest pain and palpitations.  Gastrointestinal: Negative for abdominal pain, constipation, diarrhea, nausea and vomiting.  Endocrine: Negative for polydipsia and polyuria.  Genitourinary: Negative for dysuria and hematuria.  Musculoskeletal: Negative for neck pain and neck stiffness.  Skin: Negative for rash.  Neurological: Negative for dizziness and weakness.  Psychiatric/Behavioral: Negative for agitation and behavioral problems.    Objective:   Today's Vitals: BP (!) 163/98 (BP Location: Right Arm, Patient Position: Sitting)   Pulse 80  Resp 18   Ht 5\' 3"  (1.6 m)   Wt 181 lb 1.9 oz (82.2 kg)   LMP 01/22/2011   SpO2 99%   BMI 32.08 kg/m   Physical Exam Vitals reviewed.  Constitutional:      General: She is not in acute distress.    Appearance: She is not diaphoretic.  HENT:     Head: Normocephalic and atraumatic.     Nose: Nose normal.     Mouth/Throat:     Mouth: Mucous membranes are moist.  Eyes:     General: No scleral icterus.    Extraocular Movements: Extraocular movements intact.     Pupils: Pupils are equal, round, and reactive to light.  Cardiovascular:     Rate and Rhythm: Normal rate and regular rhythm.      Pulses: Normal pulses.     Heart sounds: Normal heart sounds. No murmur heard.   Pulmonary:     Breath sounds: Normal breath sounds. No wheezing or rales.  Abdominal:     Palpations: Abdomen is soft.     Tenderness: There is no abdominal tenderness.  Musculoskeletal:     Cervical back: Neck supple. No tenderness.     Right lower leg: No edema.     Left lower leg: No edema.  Skin:    General: Skin is warm.     Findings: No rash.  Neurological:     General: No focal deficit present.     Mental Status: She is alert and oriented to person, place, and time.  Psychiatric:        Mood and Affect: Mood normal.        Behavior: Behavior normal.     Assessment & Plan:   Problem List Items Addressed This Visit      Cardiovascular and Mediastinum   Primary hypertension    BP Readings from Last 1 Encounters:  07/17/20 (!) 163/98   New-onset Started Amlodipine 5 mg QD Counseled for compliance with the medications Advised DASH diet and moderate exercise/walking, at least 150 mins/week       Relevant Medications   amLODipine (NORVASC) 5 MG tablet     Digestive   History of hepatitis C    Treated in 2016        Other   Obesity    Diet modification and moderate exercise advised DASH diet material provided      Encounter to establish care - Primary    Care established History and medications reviewed with the patient       Other Visit Diagnoses    Special screening for malignant neoplasms, colon       Relevant Orders   Cologuard      Outpatient Encounter Medications as of 07/17/2020  Medication Sig  . amLODipine (NORVASC) 5 MG tablet Take 1 tablet (5 mg total) by mouth daily.  . valACYclovir (VALTREX) 500 MG tablet TAKE 1 TABLET BY MOUTH EVERY DAY   No facility-administered encounter medications on file as of 07/17/2020.    Follow-up: Return in about 6 weeks (around 08/28/2020) for HTN.   10/28/2020, MD

## 2020-07-17 NOTE — Patient Instructions (Addendum)
Please start taking Amlodipine 5 mg once daily.  Please take Valacyclovir only during herpes flare-up. Do not take Valacyclovir daily.  Please follow DASH diet and perform moderate exercise/walking at least 150 mins/week.   [email or web address]">  DASH Eating Plan DASH stands for Dietary Approaches to Stop Hypertension. The DASH eating plan is a healthy eating plan that has been shown to:  Reduce high blood pressure (hypertension).  Reduce your risk for type 2 diabetes, heart disease, and stroke.  Help with weight loss. What are tips for following this plan? Reading food labels  Check food labels for the amount of salt (sodium) per serving. Choose foods with less than 5 percent of the Daily Value of sodium. Generally, foods with less than 300 milligrams (mg) of sodium per serving fit into this eating plan.  To find whole grains, look for the word "whole" as the first word in the ingredient list. Shopping  Buy products labeled as "low-sodium" or "no salt added."  Buy fresh foods. Avoid canned foods and pre-made or frozen meals. Cooking  Avoid adding salt when cooking. Use salt-free seasonings or herbs instead of table salt or sea salt. Check with your health care provider or pharmacist before using salt substitutes.  Do not fry foods. Cook foods using healthy methods such as baking, boiling, grilling, roasting, and broiling instead.  Cook with heart-healthy oils, such as olive, canola, avocado, soybean, or sunflower oil. Meal planning  Eat a balanced diet that includes: ? 4 or more servings of fruits and 4 or more servings of vegetables each day. Try to fill one-half of your plate with fruits and vegetables. ? 6-8 servings of whole grains each day. ? Less than 6 oz (170 g) of lean meat, poultry, or fish each day. A 3-oz (85-g) serving of meat is about the same size as a deck of cards. One egg equals 1 oz (28 g). ? 2-3 servings of  low-fat dairy each day. One serving is 1 cup (237 mL). ? 1 serving of nuts, seeds, or beans 5 times each week. ? 2-3 servings of heart-healthy fats. Healthy fats called omega-3 fatty acids are found in foods such as walnuts, flaxseeds, fortified milks, and eggs. These fats are also found in cold-water fish, such as sardines, salmon, and mackerel.  Limit how much you eat of: ? Canned or prepackaged foods. ? Food that is high in trans fat, such as some fried foods. ? Food that is high in saturated fat, such as fatty meat. ? Desserts and other sweets, sugary drinks, and other foods with added sugar. ? Full-fat dairy products.  Do not salt foods before eating.  Do not eat more than 4 egg yolks a week.  Try to eat at least 2 vegetarian meals a week.  Eat more home-cooked food and less restaurant, buffet, and fast food.   Lifestyle  When eating at a restaurant, ask that your food be prepared with less salt or no salt, if possible.  If you drink alcohol: ? Limit how much you use to:  0-1 drink a day for women who are not pregnant.  0-2 drinks a day for men. ? Be aware of how much alcohol is in your drink. In the [place] one drink equals one 12 oz bottle of beer (355 mL), one 5 oz glass of wine (148 mL), or one 1 oz glass of hard liquor (44 mL). General information  Avoid eating more than 2,300 mg of salt a day. If you have hypertension, you  may need to reduce your sodium intake to 1,500 mg a day.  Work with your health care provider to maintain a healthy body weight or to lose weight. Ask what an ideal weight is for you.  Get at least 30 minutes of exercise that causes your heart to beat faster (aerobic exercise) most days of the week. Activities may include walking, swimming, or biking.  Work with your health care provider or dietitian to adjust your eating plan to your individual calorie needs. What foods should I eat? Fruits All fresh, dried, or frozen fruit. Canned fruit in  natural juice (without added sugar). Vegetables Fresh or frozen vegetables (raw, steamed, roasted, or grilled). Low-sodium or reduced-sodium tomato and vegetable juice. Low-sodium or reduced-sodium tomato sauce and tomato paste. Low-sodium or reduced-sodium canned vegetables. Grains Whole-grain or whole-wheat bread. Whole-grain or whole-wheat pasta. Brown rice. Orpah Cobb. Bulgur. Whole-grain and low-sodium cereals. Pita bread. Low-fat, low-sodium crackers. Whole-wheat flour tortillas. Meats and other proteins Skinless chicken or [place]. Ground chicken or [place]. Pork with fat trimmed off. Fish and seafood. Egg whites. Dried beans, peas, or lentils. Unsalted nuts, nut butters, and seeds. Unsalted canned beans. Lean cuts of beef with fat trimmed off. Low-sodium, lean precooked or cured meat, such as sausages or meat loaves. Dairy Low-fat (1%) or fat-free (skim) milk. Reduced-fat, low-fat, or fat-free cheeses. Nonfat, low-sodium ricotta or cottage cheese. Low-fat or nonfat yogurt. Low-fat, low-sodium cheese. Fats and oils Soft margarine without trans fats. Vegetable oil. Reduced-fat, low-fat, or light mayonnaise and salad dressings (reduced-sodium). Canola, safflower, olive, avocado, soybean, and sunflower oils. Avocado. Seasonings and condiments Herbs. Spices. Seasoning mixes without salt. Other foods Unsalted popcorn and pretzels. Fat-free sweets. The items listed above may not be a complete list of foods and beverages you can eat. Contact a dietitian for more information. What foods should I avoid? Fruits Canned fruit in a light or heavy syrup. Fried fruit. Fruit in cream or butter sauce. Vegetables Creamed or fried vegetables. Vegetables in a cheese sauce. Regular canned vegetables (not low-sodium or reduced-sodium). Regular canned tomato sauce and paste (not low-sodium or reduced-sodium). Regular tomato and vegetable juice (not low-sodium or reduced-sodium). Rosita Fire.  Olives. Grains Baked goods made with fat, such as croissants, muffins, or some breads. Dry pasta or rice meal packs. Meats and other proteins Fatty cuts of meat. Ribs. Fried meat. Tomasa Blase. Bologna, salami, and other precooked or cured meats, such as sausages or meat loaves. Fat from the back of a pig (fatback). Bratwurst. Salted nuts and seeds. Canned beans with added salt. Canned or smoked fish. Whole eggs or egg yolks. Chicken or [place] with skin. Dairy Whole or 2% milk, cream, and half-and-half. Whole or full-fat cream cheese. Whole-fat or sweetened yogurt. Full-fat cheese. Nondairy creamers. Whipped toppings. Processed cheese and cheese spreads. Fats and oils Butter. Stick margarine. Lard. Shortening. Ghee. Bacon fat. Tropical oils, such as coconut, palm kernel, or palm oil. Seasonings and condiments Onion salt, garlic salt, seasoned salt, table salt, and sea salt. Worcestershire sauce. Tartar sauce. Barbecue sauce. Teriyaki sauce. Soy sauce, including reduced-sodium. Steak sauce. Canned and packaged gravies. Fish sauce. Oyster sauce. Cocktail sauce. Store-bought horseradish. Ketchup. Mustard. Meat flavorings and tenderizers. Bouillon cubes. Hot sauces. Pre-made or packaged marinades. Pre-made or packaged taco seasonings. Relishes. Regular salad dressings. Other foods Salted popcorn and pretzels. The items listed above may not be a complete list of foods and beverages you should avoid. Contact a dietitian for more information. Where to find more information  National Heart, Lung, and  Blood Institute: PopSteam.is  American Heart Association: www.heart.org  Academy of Nutrition and Dietetics: www.eatright.org  National Kidney Foundation: www.kidney.org Summary  The DASH eating plan is a healthy eating plan that has been shown to reduce high blood pressure (hypertension). It may also reduce your risk for type 2 diabetes, heart disease, and stroke.  When on the DASH eating plan, aim to  eat more fresh fruits and vegetables, whole grains, lean proteins, low-fat dairy, and heart-healthy fats.  With the DASH eating plan, you should limit salt (sodium) intake to 2,300 mg a day. If you have hypertension, you may need to reduce your sodium intake to 1,500 mg a day.  Work with your health care provider or dietitian to adjust your eating plan to your individual calorie needs. This information is not intended to replace advice given to you by your health care provider. Make sure you discuss any questions you have with your health care provider. Document Revised: 03/12/2019 Document Reviewed: 03/12/2019 Elsevier Patient Education  2021 [employer].

## 2020-07-17 NOTE — Assessment & Plan Note (Signed)
Diet modification and moderate exercise advised DASH diet material provided 

## 2020-07-17 NOTE — Assessment & Plan Note (Signed)
Treated in 2016

## 2020-07-17 NOTE — Assessment & Plan Note (Signed)
BP Readings from Last 1 Encounters:  07/17/20 (!) 163/98   New-onset Started Amlodipine 5 mg QD Counseled for compliance with the medications Advised DASH diet and moderate exercise/walking, at least 150 mins/week

## 2020-08-08 ENCOUNTER — Other Ambulatory Visit: Payer: Self-pay | Admitting: Internal Medicine

## 2020-08-08 DIAGNOSIS — I10 Essential (primary) hypertension: Secondary | ICD-10-CM

## 2020-08-28 ENCOUNTER — Ambulatory Visit: Payer: 59 | Admitting: Internal Medicine

## 2020-09-07 ENCOUNTER — Other Ambulatory Visit: Payer: Self-pay | Admitting: Internal Medicine

## 2020-09-07 DIAGNOSIS — I10 Essential (primary) hypertension: Secondary | ICD-10-CM

## 2020-09-22 ENCOUNTER — Ambulatory Visit: Payer: PRIVATE HEALTH INSURANCE | Admitting: Nurse Practitioner

## 2020-09-22 ENCOUNTER — Encounter: Payer: Self-pay | Admitting: Nurse Practitioner

## 2020-09-22 ENCOUNTER — Other Ambulatory Visit: Payer: Self-pay

## 2020-09-22 VITALS — BP 158/100 | HR 77 | Temp 97.3°F | Ht 63.0 in | Wt 175.0 lb

## 2020-09-22 DIAGNOSIS — R232 Flushing: Secondary | ICD-10-CM | POA: Diagnosis not present

## 2020-09-22 DIAGNOSIS — Z13220 Encounter for screening for lipoid disorders: Secondary | ICD-10-CM

## 2020-09-22 DIAGNOSIS — G4700 Insomnia, unspecified: Secondary | ICD-10-CM

## 2020-09-22 DIAGNOSIS — Z113 Encounter for screening for infections with a predominantly sexual mode of transmission: Secondary | ICD-10-CM

## 2020-09-22 DIAGNOSIS — I10 Essential (primary) hypertension: Secondary | ICD-10-CM | POA: Diagnosis not present

## 2020-09-22 DIAGNOSIS — N898 Other specified noninflammatory disorders of vagina: Secondary | ICD-10-CM | POA: Diagnosis not present

## 2020-09-22 DIAGNOSIS — Z136 Encounter for screening for cardiovascular disorders: Secondary | ICD-10-CM

## 2020-09-22 DIAGNOSIS — Z1231 Encounter for screening mammogram for malignant neoplasm of breast: Secondary | ICD-10-CM

## 2020-09-22 LAB — URINALYSIS, ROUTINE W REFLEX MICROSCOPIC
Bilirubin Urine: NEGATIVE
Glucose, UA: NEGATIVE
Hyaline Cast: NONE SEEN /LPF
Ketones, ur: NEGATIVE
Nitrite: NEGATIVE
Protein, ur: NEGATIVE
Specific Gravity, Urine: 1.015 (ref 1.001–1.035)
pH: 7 (ref 5.0–8.0)

## 2020-09-22 LAB — WET PREP FOR TRICH, YEAST, CLUE

## 2020-09-22 LAB — MICROSCOPIC MESSAGE

## 2020-09-22 MED ORDER — ESCITALOPRAM OXALATE 5 MG PO TABS: 5.0000 mg | ORAL_TABLET | Freq: Every day | ORAL | 1 refills | Status: DC

## 2020-09-22 MED ORDER — METRONIDAZOLE 500 MG PO TABS: 500.0000 mg | ORAL_TABLET | Freq: Two times a day (BID) | ORAL | 0 refills | Status: AC

## 2020-09-22 NOTE — Assessment & Plan Note (Signed)
Chronic.  Question if related to hot flashes.  Will treat hot flashes and reassess in 1 month.

## 2020-09-22 NOTE — Patient Instructions (Signed)

## 2020-09-22 NOTE — Assessment & Plan Note (Addendum)
Chronic.  Started amlodipine 5 mg about 2 months ago and is not taking consistently.  Congratulated on weight loss.  Encouraged setting an alarm on cell phone to take around the same time every day.  Encouraged checking BP at home and notify us with readings >140/90.  Goal BP less than 130/80.  CMP, lipid panel, and CBC checked today.  Follow up in 1 month.

## 2020-09-22 NOTE — Assessment & Plan Note (Signed)
Chronic, worsening recently.  Discussed options including HRT vs. SSRI - patient elects to proceed with SSRI.  Will start escitalopram 5 mg nightly.  Discussed importance of taking this medication daily.  Discussed that side effects may be present for first couple of weeks, but usually improve.  Follow up in 4 weeks.

## 2020-09-22 NOTE — Progress Notes (Signed)
Subjective:    Patient ID: [name], female    DOB: 1966/01/08, 55 y.o.   MRN: [id number]  HPI: [name] is a 55 y.o. female presenting presents for new patient visit to establish care.  Introduced to Publishing rights manager role and practice setting.  All questions answered.  Discussed provider/patient relationship and expectations.  Chief Complaint  Patient presents with  . Menopause    Having night sweats since the month of May  . Vaginal Discharge    Since the middle of may, not able to void for mirco at of yet   VAGINAL DISCHARGE Duration: weeks Discharge description: yellow , thin Pruritus: yes Dysuria: yes Malodorous: no Urinary frequency: no Fevers: no Abdominal pain: no  Sexual activity: not sexually active History of sexually transmitted diseases: no Recent antibiotic use: no Context: stable Treatments attempted: Monitstat 1 day - no help  MENOPAUSAL SYMPTOMS LMP was years ago.  Hot flashes for the past 3-4 years, worse the past couple of months. Gravida/Para:  Duration: uncontrolled Symptom severity:  Hot flashes: yes Night sweats: no2 Sleep disturbances: yes; cannot stay asleep Vaginal dryness: no Dyspareunia:no Decreased libido: no Emotional lability: no Stress incontinence: no Previous HRT/pharmacotherapy: no Hysterectomy: no GYN surgery: tubal ligation Absolute Contraindications to Hormonal Therapy:     Undiagnosed vaginal bleeding: no    Breast cancer: no    Endometrial cancer: no    Coronary disease: no    Cerebrovascular disease: no    Venous thromboembolic disease: no  HYPERTENSION Currently taking amlodipine 5 mg daily.  Has not consistently been checking blood pressure at home and has not been very consistent with the medication because she forgets.   Hypertension status: uncontrolled  Satisfied with current treatment? yes Duration of hypertension: chronic BP monitoring frequency:  not checking BP medication side  effects:  no Medication compliance: fair compliance Aspirin: no Recurrent headaches: no Visual changes: no Palpitations: no Dyspnea: no Chest pain: no Lower extremity edema: no Dizzy/lightheaded: no   No Known Allergies  Outpatient Encounter Medications as of 09/22/2020  Medication Sig  . amLODipine (NORVASC) 5 MG tablet TAKE 1 TABLET (5 MG TOTAL) BY MOUTH DAILY.  Marland Kitchen escitalopram (LEXAPRO) 5 MG tablet Take 1 tablet (5 mg total) by mouth daily.  . metroNIDAZOLE (FLAGYL) 500 MG tablet Take 1 tablet (500 mg total) by mouth 2 (two) times daily for 7 days.  . valACYclovir (VALTREX) 500 MG tablet TAKE 1 TABLET BY MOUTH EVERY DAY   No facility-administered encounter medications on file as of 09/22/2020.    Patient Active Problem List   Diagnosis Date Noted  . Insomnia 09/22/2020  . Primary hypertension 07/17/2020  . Obesity 10/02/2018  . History of hepatitis C 09/26/2017  . Liver fibrosis 11/25/2014  . Hot flashes 05/02/2014    Past Medical History:  Diagnosis Date  . Encounter to establish care 07/17/2020  . 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 Hepatitis C antibody test positive    HCV RNA load 1960  . Hypertension    Past Surgical History:  Procedure Laterality Date  . LIVER BIOPSY  April 2011   Likely HCV, ?autoimmune hepatitis, positive ANA, weak positive ANA titer, referred to [facility], lost to follow-up  . TUBAL LIGATION     Family History  Problem Relation Age of Onset  . Hypertension Sister   . Depression Sister   . Hypertension Brother   . Depression Brother   . Hypertension Brother   . Hypertension Sister   .  Depression Sister   . Colon cancer Neg Hx    Social History   Tobacco Use  . Smoking status: Never Smoker  . Smokeless tobacco: Never Used  Substance Use Topics  . Alcohol use: Yes    Alcohol/week: 4.0 standard drinks    Types: 2 Cans of beer, 2 Shots of liquor per week    Comment: socially  . Drug use: No   Review of Systems  Per HPI unless  specifically indicated above     Objective:    BP (!) 158/100   Pulse 77   Temp (!) 97.3 F (36.3 C)   Ht 5\' 3"  (1.6 m)   Wt 175 lb (79.4 kg)   LMP 01/22/2011   SpO2 98%   BMI 31.00 kg/m   Wt Readings from Last 3 Encounters:  09/22/20 175 lb (79.4 kg)  07/17/20 181 lb 1.9 oz (82.2 kg)  01/26/20 180 lb (81.6 kg)    Physical Exam Vitals and nursing note reviewed.  Constitutional:      General: She is not in acute distress.    Appearance: Normal appearance. She is obese. She is not toxic-appearing.  HENT:     Head: Normocephalic and atraumatic.  Eyes:     General: No scleral icterus.    Extraocular Movements: Extraocular movements intact.  Neck:     Vascular: No carotid bruit.  Cardiovascular:     Rate and Rhythm: Normal rate and regular rhythm.     Heart sounds: Normal heart sounds. No murmur heard.   Pulmonary:     Effort: Pulmonary effort is normal. No respiratory distress.     Breath sounds: Normal breath sounds. No wheezing, rhonchi or rales.  Musculoskeletal:     Cervical back: Normal range of motion.  Lymphadenopathy:     Cervical: No cervical adenopathy.  Skin:    General: Skin is warm and dry.     Capillary Refill: Capillary refill takes less than 2 seconds.     Coloration: Skin is not jaundiced.     Findings: No bruising.  Neurological:     Mental Status: She is alert and oriented to person, place, and time.     Gait: Gait normal.  Psychiatric:        Mood and Affect: Mood normal.        Behavior: Behavior normal.        Thought Content: Thought content normal.        Judgment: Judgment normal.       Assessment & Plan:   Problem List Items Addressed This Visit      Cardiovascular and Mediastinum   Primary hypertension    Chronic.  Started amlodipine 5 mg about 2 months ago and is not taking consistently.  Congratulated on weight loss.  Encouraged setting an alarm on cell phone to take around the same time every day.  Encouraged checking BP at  home and notify 03/27/20 with readings >140/90.  Goal BP less than 130/80.  CMP, lipid panel, and CBC checked today.  Follow up in 1 month.      Relevant Orders   Lipid Panel   COMPLETE METABOLIC PANEL WITH GFR   CBC with Differential   Hemoglobin A1c   Hot flashes - Primary    Chronic, worsening recently.  Discussed options including HRT vs. SSRI - patient elects to proceed with SSRI.  Will start escitalopram 5 mg nightly.  Discussed importance of taking this medication daily.  Discussed that side effects may be  present for first couple of weeks, but usually improve.  Follow up in 4 weeks.       Relevant Medications   escitalopram (LEXAPRO) 5 MG tablet   Other Relevant Orders   COMPLETE METABOLIC PANEL WITH GFR   CBC with Differential   Hemoglobin A1c     Other   Insomnia    Chronic.  Question if related to hot flashes.  Will treat hot flashes and reassess in 1 month.      Relevant Orders   Hemoglobin A1c    Other Visit Diagnoses    Vaginal discharge       Acute.  Wet prep +BV today.  UA microscopic showed trichomonas, 3+ leuk, 10-20 WBC, may bacteria.  Will send for culture and treat with flagyl.   Relevant Medications   metroNIDAZOLE (FLAGYL) 500 MG tablet   Other Relevant Orders   WET PREP FOR TRICH, YEAST, CLUE (Completed)   Urinalysis, Routine w reflex microscopic (Completed)   C. trachomatis/N. gonorrhoeae RNA   Breast cancer screening by mammogram       Relevant Orders   MM Digital Screening   Encounter for lipid screening for cardiovascular disease       Relevant Orders   Lipid Panel   Screening for STD (sexually transmitted disease)       Relevant Orders   HIV Antibody (routine testing w rflx)   RPR       Follow up plan: Return in about 4 weeks (around 10/20/2020) for hot flashes and BP f/u.

## 2020-09-23 LAB — C. TRACHOMATIS/N. GONORRHOEAE RNA
C. trachomatis RNA, TMA: NOT DETECTED
N. gonorrhoeae RNA, TMA: NOT DETECTED

## 2020-09-25 LAB — COMPLETE METABOLIC PANEL WITH GFR
AG Ratio: 1.5 (calc) (ref 1.0–2.5)
ALT: 21 U/L (ref 6–29)
AST: 20 U/L (ref 10–35)
Albumin: 4.6 g/dL (ref 3.6–5.1)
Alkaline phosphatase (APISO): 76 U/L (ref 37–153)
BUN: 13 mg/dL (ref 7–25)
CO2: 26 mmol/L (ref 20–32)
Calcium: 9.7 mg/dL (ref 8.6–10.4)
Chloride: 105 mmol/L (ref 98–110)
Creat: 0.84 mg/dL (ref 0.50–1.05)
GFR, Est African American: 89 mL/min/{1.73_m2} (ref >=60)
GFR, Est Non African American: 77 mL/min/{1.73_m2} (ref >=60)
Globulin: 3.1 g/dL (calc) (ref 1.9–3.7)
Glucose, Bld: 94 mg/dL (ref 65–99)
Potassium: 4.4 mmol/L (ref 3.5–5.3)
Sodium: 140 mmol/L (ref 135–146)
Total Bilirubin: 0.5 mg/dL (ref 0.2–1.2)
Total Protein: 7.7 g/dL (ref 6.1–8.1)

## 2020-09-25 LAB — CBC WITH DIFFERENTIAL/PLATELET
Absolute Monocytes: 382 cells/uL (ref 200–950)
Basophils Absolute: 32 cells/uL (ref 0–200)
Basophils Relative: 0.6 %
Eosinophils Absolute: 42 cells/uL (ref 15–500)
Eosinophils Relative: 0.8 %
HCT: 40.9 % (ref 35.0–45.0)
Hemoglobin: 13.5 g/dL (ref 11.7–15.5)
Lymphs Abs: 2003 cells/uL (ref 850–3900)
MCH: 29.3 pg (ref 27.0–33.0)
MCHC: 33 g/dL (ref 32.0–36.0)
MCV: 88.9 fL (ref 80.0–100.0)
MPV: 9.8 fL (ref 7.5–12.5)
Monocytes Relative: 7.2 %
Neutro Abs: 2841 cells/uL (ref 1500–7800)
Neutrophils Relative %: 53.6 %
Platelets: 203 10*3/uL (ref 140–400)
RBC: 4.6 10*6/uL (ref 3.80–5.10)
RDW: 12.8 % (ref 11.0–15.0)
Total Lymphocyte: 37.8 %
WBC: 5.3 10*3/uL (ref 3.8–10.8)

## 2020-09-25 LAB — HEMOGLOBIN A1C
Hgb A1c MFr Bld: 6 % of total Hgb — ABNORMAL HIGH (ref <5.7)
Mean Plasma Glucose: 126 mg/dL
eAG (mmol/L): 7 mmol/L

## 2020-09-25 LAB — LIPID PANEL
Cholesterol: 209 mg/dL — ABNORMAL HIGH (ref <200)
HDL: 57 mg/dL (ref >=50)
LDL Cholesterol (Calc): 131 mg/dL (calc) — ABNORMAL HIGH
Non-HDL Cholesterol (Calc): 152 mg/dL (calc) — ABNORMAL HIGH (ref <130)
Total CHOL/HDL Ratio: 3.7 (calc) (ref <5.0)
Triglycerides: 103 mg/dL (ref <150)

## 2020-09-25 LAB — HIV ANTIBODY (ROUTINE TESTING W REFLEX): HIV 1&2 Ab, 4th Generation: NONREACTIVE

## 2020-09-25 LAB — RPR: RPR Ser Ql: NONREACTIVE

## 2020-09-26 ENCOUNTER — Encounter: Payer: Self-pay | Admitting: Nurse Practitioner

## 2020-09-26 DIAGNOSIS — E785 Hyperlipidemia, unspecified: Secondary | ICD-10-CM | POA: Insufficient documentation

## 2020-09-26 DIAGNOSIS — R7303 Prediabetes: Secondary | ICD-10-CM | POA: Insufficient documentation

## 2020-09-29 ENCOUNTER — Other Ambulatory Visit: Payer: PRIVATE HEALTH INSURANCE

## 2020-09-29 ENCOUNTER — Other Ambulatory Visit: Payer: Self-pay

## 2020-09-29 DIAGNOSIS — N898 Other specified noninflammatory disorders of vagina: Secondary | ICD-10-CM

## 2020-10-01 LAB — URINALYSIS, ROUTINE W REFLEX MICROSCOPIC

## 2020-10-01 LAB — URINE CULTURE
MICRO NUMBER:: 11994117
SPECIMEN QUALITY:: ADEQUATE

## 2020-10-06 ENCOUNTER — Telehealth: Payer: Self-pay | Admitting: Nurse Practitioner

## 2020-10-06 ENCOUNTER — Other Ambulatory Visit: Payer: Self-pay | Admitting: Internal Medicine

## 2020-10-06 DIAGNOSIS — I10 Essential (primary) hypertension: Secondary | ICD-10-CM

## 2020-10-06 NOTE — Telephone Encounter (Signed)
FMLA paperwork dropped off and $20 fee paid. Patient will sign paperwork when she picks up documents. Paperwork placed in tray on Anitra's desk.

## 2020-10-09 ENCOUNTER — Telehealth: Payer: Self-pay

## 2020-10-09 NOTE — Telephone Encounter (Signed)
Forms have been placed in NP folder in her office

## 2020-10-11 NOTE — Telephone Encounter (Signed)
No action required, closing chart 

## 2020-10-12 NOTE — Telephone Encounter (Signed)
Pt is requesting fmla for the days she does not feel well, having hot flashes and for the days she has dr. Tyrell Antonio. States that the forms already submitted are due tomorrow. Understands forms will not be filled out due to needing appt with pcp to approve fmla. Pt has appt 10/20/20 scheduled. Will discuss then.

## 2020-10-14 ENCOUNTER — Other Ambulatory Visit: Payer: Self-pay | Admitting: Nurse Practitioner

## 2020-10-14 DIAGNOSIS — R232 Flushing: Secondary | ICD-10-CM

## 2020-10-20 ENCOUNTER — Ambulatory Visit: Payer: PRIVATE HEALTH INSURANCE | Admitting: Nurse Practitioner

## 2020-10-27 ENCOUNTER — Ambulatory Visit: Payer: PRIVATE HEALTH INSURANCE | Admitting: Nurse Practitioner

## 2020-10-27 ENCOUNTER — Other Ambulatory Visit: Payer: Self-pay

## 2020-10-27 VITALS — BP 142/86 | HR 70 | Temp 98.3°F | Wt 173.8 lb

## 2020-10-27 DIAGNOSIS — I10 Essential (primary) hypertension: Secondary | ICD-10-CM | POA: Diagnosis not present

## 2020-10-27 DIAGNOSIS — R232 Flushing: Secondary | ICD-10-CM | POA: Diagnosis not present

## 2020-10-27 MED ORDER — ESCITALOPRAM OXALATE 10 MG PO TABS: 10.0000 mg | ORAL_TABLET | Freq: Every day | ORAL | 1 refills | Status: DC

## 2020-10-27 NOTE — Progress Notes (Addendum)
Subjective:    Patient ID: [name], female    DOB: 08-04-1965, 55 y.o.   MRN: [id number]  HPI: [name] is a 55 y.o. female presenting for follow up of hot flashes and to discuss FMLA paperwork.  Chief Complaint  Patient presents with   Follow-up    Hot flashes meds don't see to be working, possible change or increase  needs to talk about fmla as well    HYPERTENSION Currently taking amlodipine 5 mg daily.  Has not been checking her blood pressure at home regularly. Hypertension status: stable  Satisfied with current treatment? yes Duration of hypertension: chronic BP monitoring frequency:  not checking BP medication side effects:  no Medication compliance: Excellent Aspirin: yes Recurrent headaches: yes Visual changes: no Palpitations: no Dyspnea: no Chest pain: no Lower extremity edema: no Dizzy/lightheaded: no  MENOPAUSAL SYMPTOMS At her last visit, we started Lexapro 5 mg daily to help with hot flashes related to menopause.  She thinks this medication is helping minimally.  Wondering if it can be increased today.  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  She reports on bad days with her hot flashes, she gets very lightheaded and dizzy and makes it hard for her to work. Duration: stable Symptom severity: severe Hot flashes: yes Night sweats: yes Sleep disturbances:  sometimes Vaginal dryness: no Dyspareunia:no Decreased libido: no Emotional lability: no Stress incontinence: no Previous HRT/pharmacotherapy: no Hysterectomy: no  No Known Allergies  Outpatient Encounter Medications as of 10/27/2020  Medication Sig   amLODipine (NORVASC) 5 MG tablet TAKE 1 TABLET (5 MG TOTAL) BY MOUTH DAILY.   valACYclovir (VALTREX) 500 MG tablet TAKE 1 TABLET BY MOUTH EVERY DAY   [DISCONTINUED] escitalopram (LEXAPRO) 5 MG tablet Take 1 tablet (5 mg total)  by mouth daily. Dx: R23.2   escitalopram (LEXAPRO) 10 MG tablet Take 1 tablet (10 mg total) by mouth daily. Dx: R23.2   No facility-administered encounter medications on file as of 10/27/2020.    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 Interim medical history since our last visit reviewed.  Review of Systems Per HPI unless specifically indicated above     Objective:    BP (!) 142/86   Pulse 70   Temp 98.3 F (36.8 C)   Wt 173 lb 12.8 oz (78.8 kg)   LMP 01/22/2011   SpO2 99%   BMI 30.79 kg/m   Wt Readings from Last 3 Encounters:  10/27/20 173 lb 12.8 oz (78.8 kg)  09/22/20 175 lb (79.4 kg)  07/17/20 181 lb 1.9 oz (82.2 kg)    Physical Exam Vitals and nursing note reviewed.  Constitutional:      General: She is not in acute distress.    Appearance: Normal appearance. She is not toxic-appearing.  Eyes:  General: No scleral icterus.    Extraocular Movements: Extraocular movements intact.  Cardiovascular:     Rate and Rhythm: Normal rate and regular rhythm.     Heart sounds: Normal heart sounds. No murmur heard. Pulmonary:     Effort: Pulmonary effort is normal. No respiratory distress.     Breath sounds: Normal breath sounds. No wheezing, rhonchi or rales.  Musculoskeletal:     Cervical back: Normal range of motion.  Skin:    General: Skin is warm and dry.     Capillary Refill: Capillary refill takes less than 2 seconds.     Coloration: Skin is not  jaundiced or pale.     Findings: No erythema.  Neurological:     Mental Status: She is alert and oriented to person, place, and time.     Motor: No weakness.     Gait: Gait normal.  Psychiatric:        Mood and Affect: Mood normal.        Behavior: Behavior normal.        Thought Content: Thought content normal.        Judgment: Judgment normal.      Assessment & Plan:   Problem List Items Addressed This Visit       Cardiovascular and Mediastinum   Primary hypertension    Chronic.  Blood pressure elevated above goal of less than 130/80 in clinic today.  Encouraged patient to check blood pressure at home and notify us with readings in 1 week.  If remaining elevated greater than 140/90, will need to add an additional medication to help lower blood pressure like HCTZ.  Discussed long-term effects of uncontrolled blood pressure including heart failure and kidney damage.  We will recheck in 1 month at follow-up for hot flashes.       Hot flashes - Primary    Chronic.  Will increase to escitalopram 10 mg nightly.  Continue taking this medication daily and will recheck in 4 weeks.  FMLA paperwork filled out and copy placed in chart.       Relevant Medications   escitalopram (LEXAPRO) 10 MG tablet     Follow up plan: Return in about 4 weeks (around 11/24/2020) for Hot flashes, blood pressure.

## 2020-10-28 ENCOUNTER — Encounter: Payer: Self-pay | Admitting: Nurse Practitioner

## 2020-10-28 NOTE — Assessment & Plan Note (Signed)
Chronic.  Will increase to escitalopram 10 mg nightly.  Continue taking this medication daily and will recheck in 4 weeks.  FMLA paperwork filled out and copy placed in chart.

## 2020-10-28 NOTE — Assessment & Plan Note (Addendum)
Chronic.  Blood pressure elevated above goal of less than 130/80 in clinic today.  Encouraged patient to check blood pressure at home and notify us with readings in 1 week.  If remaining elevated greater than 140/90, will need to add an additional medication to help lower blood pressure like HCTZ.  Discussed long-term effects of uncontrolled blood pressure including heart failure and kidney damage.  We will recheck in 1 month at follow-up for hot flashes.

## 2020-11-09 ENCOUNTER — Other Ambulatory Visit: Payer: Self-pay | Admitting: Internal Medicine

## 2020-11-09 DIAGNOSIS — I10 Essential (primary) hypertension: Secondary | ICD-10-CM

## 2020-11-21 ENCOUNTER — Other Ambulatory Visit: Payer: Self-pay | Admitting: Nurse Practitioner

## 2020-11-21 DIAGNOSIS — R232 Flushing: Secondary | ICD-10-CM

## 2020-11-23 ENCOUNTER — Other Ambulatory Visit: Payer: Self-pay | Admitting: Internal Medicine

## 2020-11-23 DIAGNOSIS — I10 Essential (primary) hypertension: Secondary | ICD-10-CM

## 2020-11-24 ENCOUNTER — Ambulatory Visit: Payer: PRIVATE HEALTH INSURANCE | Admitting: Nurse Practitioner

## 2020-11-24 ENCOUNTER — Ambulatory Visit (HOSPITAL_COMMUNITY)
Admission: RE | Admit: 2020-11-24 | Discharge: 2020-11-24 | Disposition: A | Discharge status: Home / Self Care | Payer: 59 | Source: Ambulatory Visit | Attending: Nurse Practitioner | Admitting: Nurse Practitioner

## 2020-11-24 ENCOUNTER — Other Ambulatory Visit: Payer: Self-pay

## 2020-11-24 ENCOUNTER — Encounter: Payer: Self-pay | Admitting: Nurse Practitioner

## 2020-11-24 VITALS — BP 140/90 | HR 65 | Temp 98.5°F | Ht 63.0 in | Wt 173.3 lb

## 2020-11-24 DIAGNOSIS — Z1231 Encounter for screening mammogram for malignant neoplasm of breast: Secondary | ICD-10-CM | POA: Diagnosis present

## 2020-11-24 DIAGNOSIS — N898 Other specified noninflammatory disorders of vagina: Secondary | ICD-10-CM | POA: Diagnosis not present

## 2020-11-24 DIAGNOSIS — R232 Flushing: Secondary | ICD-10-CM | POA: Diagnosis not present

## 2020-11-24 DIAGNOSIS — I10 Essential (primary) hypertension: Secondary | ICD-10-CM

## 2020-11-24 LAB — WET PREP FOR TRICH, YEAST, CLUE

## 2020-11-24 MED ORDER — HYDROCHLOROTHIAZIDE 12.5 MG PO CAPS: 12.5000 mg | ORAL_CAPSULE | Freq: Every day | ORAL | 1 refills | Status: DC

## 2020-11-24 MED ORDER — METRONIDAZOLE 0.75 % VA GEL: 1.0000 | Freq: Every day | VAGINAL | 0 refills | Status: DC

## 2020-11-24 NOTE — Assessment & Plan Note (Signed)
Chronic, stable with escitalopram 10 mg daily.  Will continue this medication for now. Follow up in 6 months.

## 2020-11-24 NOTE — Progress Notes (Signed)
Subjective:    Patient ID: [name], female    DOB: 1966/01/03, 55 y.o.   MRN: [id number]  HPI: [name] is a 55 y.o. female presenting for hot flashes follow up.  Chief Complaint  Patient presents with   Hot Flashes    1 month follow up, medication working well.  Asking for rx for bp cuff   HYPERTENSION Hypertension status: stable  Satisfied with current treatment? no Duration of hypertension: chronic BP monitoring frequency:  not checking BP medication side effects:  no Aspirin: no Recurrent headaches: no Visual changes: no Palpitations: no Dyspnea: no Chest pain: no Lower extremity edema: no Dizzy/lightheaded: no Physical activity - started working out again this week  VAGINAL DISCHARGE Reports she was having sex a few weeks ago and her condom broke.  She has been having a clear discharge since then. Duration: weeks Discharge description: clear, thin  Pruritus: no Dysuria: no Malodorous: yes Urinary frequency: no Fevers: no Abdominal pain: no  Sexual activity: currently sexually active with 1 female partner  History of sexually transmitted diseases: no Recent antibiotic use: no Context: worse  Treatments attempted: nothing  MENOPAUSAL SYMPTOMS She reports the increase in Lexapro is helping with her hot flashes. Duration: better Symptom severity: mild Hot flashes: yes Night sweats: no Sleep disturbances: no Vaginal dryness: no Dyspareunia:no Decreased libido: no Emotional lability: no Stress incontinence: no  No Known Allergies  Outpatient Encounter Medications as of 11/24/2020  Medication Sig   amLODipine (NORVASC) 5 MG tablet TAKE 1 TABLET (5 MG TOTAL) BY MOUTH DAILY.   escitalopram (LEXAPRO) 10 MG tablet TAKE 1 TABLET (10 MG TOTAL) BY MOUTH DAILY. DX: R23.2   hydrochlorothiazide (MICROZIDE) 12.5 MG capsule Take 1 capsule (12.5 mg total) by mouth daily.   metroNIDAZOLE (METROGEL VAGINAL) 0.75 % vaginal gel Place 1  Applicatorful vaginally at bedtime.   valACYclovir (VALTREX) 500 MG tablet TAKE 1 TABLET BY MOUTH EVERY DAY   No facility-administered encounter medications on file as of 11/24/2020.    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 Interim medical history since our last visit reviewed.  Review of Systems Per HPI unless specifically indicated above     Objective:    BP 140/90   Pulse 65   Temp 98.5 F (36.9 C)   Ht 5\' 3"  (1.6 m)   Wt 173 lb 4.8 oz (78.6 kg)   LMP 01/22/2011   SpO2 98%   BMI 30.70 kg/m   Wt Readings from Last 3 Encounters:  11/24/20 173 lb 4.8 oz (78.6 kg)  10/27/20 173 lb 12.8 oz (78.8 kg)  09/22/20 175 lb (79.4 kg)    Physical Exam Vitals and nursing note reviewed.  Constitutional:      General: She is not in acute distress.    Appearance: Normal appearance. She is not toxic-appearing.  Eyes:     General: No scleral icterus.    Extraocular Movements:  Extraocular movements intact.  Genitourinary:    Comments: Deferred using shared decision making Skin:    General: Skin is warm and dry.     Capillary Refill: Capillary refill takes less than 2 seconds.     Coloration: Skin is not jaundiced or pale.     Findings: No erythema.  Neurological:     Mental Status: She is alert and oriented to person, place, and time.     Motor: No weakness.     Gait: Gait normal.  Psychiatric:        Mood and Affect: Mood normal.        Behavior: Behavior normal.         Thought Content: Thought content normal.        Judgment: Judgment normal.       Assessment & Plan:   Problem List Items Addressed This Visit       Cardiovascular and Mediastinum   Primary hypertension    Chronic.  BP remains elevated today.  Prescription given for BP cuff.  Will start HCTZ 12.5 mg daily in the morning and have her back next month to recheck and check electrolytes.        Relevant Medications   hydrochlorothiazide (MICROZIDE) 12.5 MG capsule   Hot flashes - Primary    Chronic, stable with escitalopram 10 mg daily.  Will continue this medication for now. Follow up in 6 months.       Relevant Medications   hydrochlorothiazide (MICROZIDE) 12.5 MG capsule   Other Visit Diagnoses     Vaginal discharge       Self swab today positive for clue cells.  We will treat for bacterial vaginosis with MetroGel nightly x7 nights.   Relevant Medications   metroNIDAZOLE (METROGEL VAGINAL) 0.75 % vaginal gel   Other Relevant Orders   WET PREP FOR TRICH, YEAST, CLUE (Completed)        Follow up plan: Return for 1 month BP f/u.

## 2020-11-24 NOTE — Assessment & Plan Note (Signed)
Chronic.  BP remains elevated today.  Prescription given for BP cuff.  Will start HCTZ 12.5 mg daily in the morning and have her back next month to recheck and check electrolytes.

## 2020-11-27 ENCOUNTER — Ambulatory Visit: Payer: PRIVATE HEALTH INSURANCE | Admitting: Nurse Practitioner

## 2020-12-06 ENCOUNTER — Other Ambulatory Visit: Payer: Self-pay | Admitting: *Deleted

## 2020-12-06 MED ORDER — VALACYCLOVIR HCL 500 MG PO TABS: 500.0000 mg | ORAL_TABLET | Freq: Every day | ORAL | 2 refills | Status: AC

## 2020-12-17 ENCOUNTER — Other Ambulatory Visit: Payer: Self-pay | Admitting: Nurse Practitioner

## 2020-12-17 DIAGNOSIS — I10 Essential (primary) hypertension: Secondary | ICD-10-CM

## 2020-12-17 DIAGNOSIS — R232 Flushing: Secondary | ICD-10-CM

## 2020-12-22 ENCOUNTER — Ambulatory Visit: Payer: PRIVATE HEALTH INSURANCE | Admitting: Nurse Practitioner

## 2020-12-29 ENCOUNTER — Encounter: Payer: Self-pay | Admitting: Nurse Practitioner

## 2020-12-29 ENCOUNTER — Ambulatory Visit: Payer: PRIVATE HEALTH INSURANCE | Admitting: Nurse Practitioner

## 2020-12-29 ENCOUNTER — Other Ambulatory Visit: Payer: Self-pay

## 2020-12-29 VITALS — BP 142/90 | HR 76 | Temp 98.6°F | Ht 63.0 in | Wt 174.8 lb

## 2020-12-29 DIAGNOSIS — R232 Flushing: Secondary | ICD-10-CM

## 2020-12-29 DIAGNOSIS — I10 Essential (primary) hypertension: Secondary | ICD-10-CM | POA: Diagnosis not present

## 2020-12-29 MED ORDER — HYDROCHLOROTHIAZIDE 25 MG PO TABS: 25.0000 mg | ORAL_TABLET | Freq: Every day | ORAL | 1 refills | Status: DC

## 2020-12-29 MED ORDER — ESCITALOPRAM OXALATE 20 MG PO TABS: 20.0000 mg | ORAL_TABLET | Freq: Every day | ORAL | 1 refills | Status: DC

## 2020-12-29 NOTE — Progress Notes (Signed)
Subjective:    Patient ID: [name], female    DOB: Aug 24, 1965, 55 y.o.   MRN: [id number]  HPI: [name] is a 55 y.o. female presenting for blood pressure follow up.  Chief Complaint  Patient presents with   Follow-up    HYPERTENSION Currently taking hydrochlorothiazide 12.5 mg daily and amlodipine 5 mg daily for blood pressure.  She reports her blood pressures have been about 140/90 at home. Hypertension status: better  Satisfied with current treatment? yes Duration of hypertension: chronic BP monitoring frequency:  daily BP range: 140s/90s BP medication side effects:  no Medication compliance: Excellent Aspirin: no Recurrent headaches: no Visual changes: no Palpitations: no Dyspnea: no Chest pain: no Lower extremity edema: no Dizzy/lightheaded: no  MENOPAUSAL SYMPTOMS She reports her hot flashes have returned and are worsening.  She is wondering if Lexapro can be increased. Duration: worse Symptom severity: moderate Hot flashes: yes Night sweats: yes Sleep disturbances: no Vaginal dryness:  N/A Dyspareunia: N/A Decreased libido: no Emotional lability: No  No Known Allergies  Outpatient Encounter Medications as of 12/29/2020  Medication Sig   escitalopram (LEXAPRO) 20 MG tablet Take 1 tablet (20 mg total) by mouth daily.   hydrochlorothiazide (HYDRODIURIL) 25 MG tablet Take 1 tablet (25 mg total) by mouth daily.   amLODipine (NORVASC) 5 MG tablet TAKE 1 TABLET (5 MG TOTAL) BY MOUTH DAILY.   valACYclovir (VALTREX) 500 MG tablet Take 1 tablet (500 mg total) by mouth daily.   [DISCONTINUED] escitalopram (LEXAPRO) 10 MG tablet TAKE 1 TABLET (10 MG TOTAL) BY MOUTH DAILY. DX: R23.2   [DISCONTINUED] hydrochlorothiazide (MICROZIDE) 12.5 MG capsule TAKE 1 CAPSULE BY MOUTH EVERY DAY   [DISCONTINUED] metroNIDAZOLE (METROGEL VAGINAL) 0.75 % vaginal gel Place 1 Applicatorful vaginally at bedtime.   No facility-administered encounter medications on  file as of 12/29/2020.    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 Interim medical history since our last visit reviewed.  Review of Systems Per HPI unless specifically indicated above     Objective:    BP (!) 142/90   Pulse 76   Temp 98.6 F (37 C) (Oral)   Ht 5\' 3"  (1.6 m)   Wt 174 lb 12.8 oz (79.3 kg)   LMP 01/22/2011   SpO2 97%   BMI 30.96 kg/m   Wt Readings from Last 3 Encounters:  12/29/20 174 lb 12.8 oz (79.3 kg)  11/24/20 173 lb 4.8 oz (78.6 kg)  10/27/20 173 lb 12.8 oz (78.8 kg)    Physical Exam Vitals and nursing note reviewed.  Constitutional:      General: She is not in acute distress.    Appearance: Normal appearance. She is not toxic-appearing.  Cardiovascular:     Rate and Rhythm: Normal rate and regular rhythm.     Heart sounds: Normal heart sounds. No murmur heard. Pulmonary:     Effort: Pulmonary effort is normal. No respiratory distress.  Breath sounds: Normal breath sounds. No wheezing, rhonchi or rales.  Skin:    General: Skin is warm and dry.     Capillary Refill: Capillary refill takes less than 2 seconds.     Coloration: Skin is not jaundiced or pale.     Findings: No erythema.  Neurological:     Mental Status: She is alert and oriented to person, place, and time.     Motor: No weakness.     Gait: Gait normal.  Psychiatric:        Mood and Affect: Mood normal.        Behavior: Behavior normal.        Thought Content:  Thought content normal.        Judgment: Judgment normal.      Assessment & Plan:   Problem List Items Addressed This Visit       Cardiovascular and Mediastinum   Primary hypertension    Chronic.  Blood pressure remains elevated today in clinic and reportedly at home.  We will plan to increase hydrochlorothiazide to 25 mg daily and continue amlodipine 5 mg daily.  We will hold off on checking kidney function until next visit.  Follow-up in 1 month.      Relevant Medications   hydrochlorothiazide (HYDRODIURIL) 25 MG tablet   Hot flashes - Primary    Chronic.  Given recent worsening in hot flashes, will increase Lexapro to 20 mg daily.  Follow-up in 1 month to see how this is going.      Relevant Medications   escitalopram (LEXAPRO) 20 MG tablet   hydrochlorothiazide (HYDRODIURIL) 25 MG tablet     Follow up plan: Return in about 4 weeks (around 01/26/2021) for hot flashes and BP follow up.

## 2020-12-31 NOTE — Assessment & Plan Note (Signed)
Chronic.  Given recent worsening in hot flashes, will increase Lexapro to 20 mg daily.  Follow-up in 1 month to see how this is going.

## 2020-12-31 NOTE — Assessment & Plan Note (Signed)
Chronic.  Blood pressure remains elevated today in clinic and reportedly at home.  We will plan to increase hydrochlorothiazide to 25 mg daily and continue amlodipine 5 mg daily.  We will hold off on checking kidney function until next visit.  Follow-up in 1 month.

## 2021-01-31 ENCOUNTER — Ambulatory Visit: Payer: 59 | Admitting: Nurse Practitioner

## 2021-02-02 ENCOUNTER — Ambulatory Visit: Payer: 59 | Admitting: Nurse Practitioner

## 2021-02-09 ENCOUNTER — Ambulatory Visit (INDEPENDENT_AMBULATORY_CARE_PROVIDER_SITE_OTHER): Payer: 59 | Admitting: Nurse Practitioner

## 2021-02-09 ENCOUNTER — Other Ambulatory Visit: Payer: Self-pay

## 2021-02-09 ENCOUNTER — Encounter: Payer: Self-pay | Admitting: Nurse Practitioner

## 2021-02-09 VITALS — BP 132/82 | HR 70 | Temp 98.3°F | Ht 63.0 in | Wt 170.0 lb

## 2021-02-09 DIAGNOSIS — R232 Flushing: Secondary | ICD-10-CM

## 2021-02-09 DIAGNOSIS — I10 Essential (primary) hypertension: Secondary | ICD-10-CM | POA: Diagnosis not present

## 2021-02-09 DIAGNOSIS — G4700 Insomnia, unspecified: Secondary | ICD-10-CM

## 2021-02-09 MED ORDER — HYDROCHLOROTHIAZIDE 12.5 MG PO TABS: 12.5000 mg | ORAL_TABLET | Freq: Every day | ORAL | 1 refills | Status: DC

## 2021-02-09 MED ORDER — TRAZODONE HCL 50 MG PO TABS: 25.0000 mg | ORAL_TABLET | Freq: Every evening | ORAL | 1 refills | Status: DC | PRN

## 2021-02-09 MED ORDER — ESCITALOPRAM OXALATE 20 MG PO TABS: 20.0000 mg | ORAL_TABLET | Freq: Every day | ORAL | 1 refills | Status: DC

## 2021-02-09 MED ORDER — AMLODIPINE BESYLATE 5 MG PO TABS: 5.0000 mg | ORAL_TABLET | Freq: Every day | ORAL | 1 refills | Status: DC

## 2021-02-09 NOTE — Assessment & Plan Note (Signed)
Chronic.  Stable with Lexapro 20 mg daily.  Continue this medication.  Follow up in 3 months.

## 2021-02-09 NOTE — Assessment & Plan Note (Signed)
Chronic.  Hot flashes have improved.  Start Trazodone 25-50 mg nightly prn to help with sleep.  Follow up if this does not help.

## 2021-02-09 NOTE — Assessment & Plan Note (Addendum)
Chronic.  Blood pressure is near goal today in clinic.  Patient tells me she has not consistently been taking both blood pressure medications.  She will send me a picture of the medication bottles when she gets home today.  Plan to continue both amlodipine 5 mg daily and take HCTZ 12.5 mg daily.  BP goal less than 130/80, continue checking at home and notify us with readings out of the range.  Check kidney function and urine microalbumin level today.  Follow up in 3 months.

## 2021-02-09 NOTE — Progress Notes (Signed)
Subjective:    Patient ID: [name], female    DOB: 07/26/1965, 55 y.o.   MRN: [id number]  HPI: [name] is a 55 y.o. female presenting for blood pressure follow up.  Chief Complaint  Patient presents with   F/U Blood pressure    Blood pressure is up an down   HYPERTENSION She is only taking one blood pressure pill.  Not sure which one it is.  She has been checking her blood pressure in the morning before she takes her medication.  She is supposed to be taking amlodipine 5 mg daily and HCTZ 25 mg daily. Hypertension status: controlled  BP monitoring frequency:  daily BP range: 160s on top, does not remember the bottom Medication compliance: excellent Aspirin: no Recurrent headaches: no Visual changes: no Palpitations: no Dyspnea: no Chest pain: no Lower extremity edema: no Dizzy/lightheaded: no  MENOPAUSAL SYMPTOMS Currently taking Lexapro 20 mg daily for hot flashes.  This seems to be working well for her.  Duration: chronic Symptom severity: mild Hot flashes: yes Night sweats: no Sleep disturbances: no Vaginal dryness:  N/A Dyspareunia: N/A Decreased libido: no Emotional lability: No  Also tells me she has been having trouble sleeping - falling asleep and staying asleep.  She tries to go to bed at the same time every night.  She does not wake up to use the bathroom.  She does not sleep with the TV on.  No Known Allergies  Outpatient Encounter Medications as of 02/09/2021  Medication Sig   traZODone (DESYREL) 50 MG tablet Take 0.5-1 tablets (25-50 mg total) by mouth at bedtime as needed for sleep.   valACYclovir (VALTREX) 500 MG tablet Take 1 tablet (500 mg total) by mouth daily.   [DISCONTINUED] amLODipine (NORVASC) 5 MG tablet TAKE 1 TABLET (5 MG TOTAL) BY MOUTH DAILY.   [DISCONTINUED] escitalopram (LEXAPRO) 20 MG tablet Take 1 tablet (20 mg total) by mouth daily.   [DISCONTINUED] hydrochlorothiazide (HYDRODIURIL) 25 MG tablet Take 1  tablet (25 mg total) by mouth daily.   amLODipine (NORVASC) 5 MG tablet Take 1 tablet (5 mg total) by mouth daily.   escitalopram (LEXAPRO) 20 MG tablet Take 1 tablet (20 mg total) by mouth daily.   hydrochlorothiazide (HYDRODIURIL) 12.5 MG tablet Take 1 tablet (12.5 mg total) by mouth daily.   No facility-administered encounter medications on file as of 02/09/2021.    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 Interim medical history since our last visit reviewed.  Review of Systems Per HPI unless specifically indicated above     Objective:    BP 132/82   Pulse 70   Temp 98.3 F (36.8 C)   Ht 5\' 3"  (1.6 m)   Wt 170 lb (77.1 kg)   LMP 01/22/2011   SpO2 98%   BMI 30.11 kg/m   Wt Readings from Last 3 Encounters:  02/09/21 170 lb (77.1 kg)  12/29/20  174 lb 12.8 oz (79.3 kg)  11/24/20 173 lb 4.8 oz (78.6 kg)    Physical Exam Vitals and nursing note reviewed.  Constitutional:      General: She is not in acute distress.    Appearance: Normal appearance. She is not toxic-appearing.  Cardiovascular:     Rate and Rhythm: Normal rate and regular rhythm.     Heart sounds: Normal heart sounds. No murmur heard. Pulmonary:     Effort: Pulmonary effort is normal. No respiratory distress.     Breath sounds: Normal breath sounds. No wheezing, rhonchi or rales.  Musculoskeletal:     Right lower leg: No edema.     Left lower leg: No edema.  Skin:    General: Skin is warm  and dry.     Capillary Refill: Capillary refill takes less than 2 seconds.     Coloration: Skin is not jaundiced or pale.     Findings: No erythema.  Neurological:     Mental Status: She is alert and oriented to person, place, and time.     Motor: No weakness.     Gait: Gait normal.  Psychiatric:        Mood and Affect: Mood normal.        Behavior: Behavior normal.        Thought Content: Thought content normal.        Judgment: Judgment normal.      Assessment & Plan:   Problem List Items Addressed This Visit       Cardiovascular and Mediastinum   Primary hypertension    Chronic.  Blood pressure is near goal today in clinic.  Patient tells me she has not consistently been taking both blood pressure medications.  She will send me a picture of the medication bottles when she gets home today.  Plan to continue both amlodipine 5 mg daily and take HCTZ 12.5 mg daily.  BP goal less than 130/80, continue checking at home and notify us with readings out of the range.  Check kidney function and urine microalbumin level today.  Follow up in 3 months.       Relevant Medications   amLODipine (NORVASC) 5 MG tablet   hydrochlorothiazide (HYDRODIURIL) 12.5 MG tablet   Other Relevant Orders   COMPLETE METABOLIC PANEL WITH GFR (Completed)   Microalbumin, urine (Completed)   Hot flashes    Chronic.  Stable with Lexapro 20 mg daily.  Continue this medication.  Follow up in 3 months.       Relevant Medications   escitalopram (LEXAPRO) 20 MG tablet   amLODipine (NORVASC) 5 MG tablet   hydrochlorothiazide (HYDRODIURIL) 12.5 MG tablet     Other   Insomnia - Primary    Chronic.  Hot flashes have improved.  Start Trazodone 25-50 mg nightly prn to help with sleep.  Follow up if this does not help.       Relevant Medications   traZODone (DESYREL) 50 MG tablet     Follow up plan: Return in about 3 months (around 05/12/2021) for follow up.

## 2021-02-10 LAB — COMPLETE METABOLIC PANEL WITH GFR
AG Ratio: 1.5 (calc) (ref 1.0–2.5)
ALT: 18 U/L (ref 6–29)
AST: 18 U/L (ref 10–35)
Albumin: 4.6 g/dL (ref 3.6–5.1)
Alkaline phosphatase (APISO): 72 U/L (ref 37–153)
BUN: 14 mg/dL (ref 7–25)
CO2: 26 mmol/L (ref 20–32)
Calcium: 9.6 mg/dL (ref 8.6–10.4)
Chloride: 103 mmol/L (ref 98–110)
Creat: 0.87 mg/dL (ref 0.50–1.03)
Globulin: 3 g/dL (calc) (ref 1.9–3.7)
Glucose, Bld: 86 mg/dL (ref 65–99)
Potassium: 4.2 mmol/L (ref 3.5–5.3)
Sodium: 139 mmol/L (ref 135–146)
Total Bilirubin: 0.3 mg/dL (ref 0.2–1.2)
Total Protein: 7.6 g/dL (ref 6.1–8.1)
eGFR: 78 mL/min/{1.73_m2} (ref >=60)

## 2021-02-10 LAB — MICROALBUMIN, URINE: Microalb, Ur: 0.5 mg/dL

## 2021-03-06 ENCOUNTER — Other Ambulatory Visit: Payer: Self-pay | Admitting: Nurse Practitioner

## 2021-03-06 DIAGNOSIS — G4700 Insomnia, unspecified: Secondary | ICD-10-CM

## 2021-04-21 ENCOUNTER — Other Ambulatory Visit: Payer: Self-pay | Admitting: Nurse Practitioner

## 2021-04-21 DIAGNOSIS — R232 Flushing: Secondary | ICD-10-CM

## 2021-05-14 ENCOUNTER — Ambulatory Visit: Payer: 59 | Admitting: Nurse Practitioner

## 2021-05-25 ENCOUNTER — Ambulatory Visit (INDEPENDENT_AMBULATORY_CARE_PROVIDER_SITE_OTHER): Payer: 59 | Admitting: Nurse Practitioner

## 2021-05-25 ENCOUNTER — Encounter: Payer: Self-pay | Admitting: Nurse Practitioner

## 2021-05-25 ENCOUNTER — Other Ambulatory Visit: Payer: Self-pay

## 2021-05-25 DIAGNOSIS — R232 Flushing: Secondary | ICD-10-CM

## 2021-05-25 DIAGNOSIS — I10 Essential (primary) hypertension: Secondary | ICD-10-CM | POA: Diagnosis not present

## 2021-05-25 MED ORDER — ESCITALOPRAM OXALATE 20 MG PO TABS: 20.0000 mg | ORAL_TABLET | Freq: Every day | ORAL | 1 refills | Status: AC

## 2021-05-25 MED ORDER — AMLODIPINE BESYLATE 5 MG PO TABS: 5.0000 mg | ORAL_TABLET | Freq: Every day | ORAL | 1 refills | Status: AC

## 2021-05-25 MED ORDER — HYDROCHLOROTHIAZIDE 12.5 MG PO TABS: 12.5000 mg | ORAL_TABLET | Freq: Every day | ORAL | 1 refills | Status: AC

## 2021-05-25 NOTE — Assessment & Plan Note (Signed)
Chronic.  BP elevated today in office, however reports good control at home.  Plan to continue current medications for now including amlodipine 5 mg daily and HCTZ 12.5 mg daily.  Patient asks me at the end of the visit to fill out FMLA paperwork for her for high blood pressure.  She reports if she eats something she is not supposed to eat, her blood pressure spikes at work and this makes her feel bad so she gets sent home.  She does not want to get into disciplinary action at work for having to go home.  We discussed dietary and lifestyle changes and foods to avoid that spike her blood pressure. I informed her that I am not comfortable writing disability paperwork for her to be planned out of work for uncontrolled blood pressure when we know what the cause is.  She verbalized understanding.

## 2021-05-25 NOTE — Assessment & Plan Note (Signed)
Chronic.  Stable with Lexapro 20 mg daily.  Continue this medication.  Refills given.

## 2021-05-25 NOTE — Progress Notes (Signed)
Subjective:    Patient ID: [name], female    DOB: Feb 10, 1966, 56 y.o.   MRN: [id number]  HPI: [name] is a 56 y.o. female presenting for blood pressure follow up.  Chief Complaint  Patient presents with   Follow-up   HYPERTENSION Currently taking amlodipine 5 mg daily and HCTZ 12.5 mg daily for blood pressure.  Hypertension status: stable  BP monitoring frequency:  a few times a week BP range: 130s on top, does not remember bottom Medication compliance: excellent Aspirin: no Recurrent headaches: no Visual changes: no Palpitations: no Dyspnea: no Chest pain: no Lower extremity edema: no Dizzy/lightheaded: no  She is also taking Lexapro 20 mg daily for hot flashes which completely controls her symptoms.  No Known Allergies  Outpatient Encounter Medications as of 05/25/2021  Medication Sig   valACYclovir (VALTREX) 500 MG tablet Take 1 tablet (500 mg total) by mouth daily.   [DISCONTINUED] amLODipine (NORVASC) 5 MG tablet Take 1 tablet (5 mg total) by mouth daily.   [DISCONTINUED] escitalopram (LEXAPRO) 20 MG tablet Take 1 tablet (20 mg total) by mouth daily.   [DISCONTINUED] escitalopram (LEXAPRO) 5 MG tablet TAKE 1 TABLET (5 MG TOTAL) BY MOUTH DAILY. DX: R23.2   [DISCONTINUED] hydrochlorothiazide (HYDRODIURIL) 12.5 MG tablet Take 1 tablet (12.5 mg total) by mouth daily.   [DISCONTINUED] traZODone (DESYREL) 50 MG tablet TAKE 0.5-1 TABLETS BY MOUTH AT BEDTIME AS NEEDED FOR SLEEP.   amLODipine (NORVASC) 5 MG tablet Take 1 tablet (5 mg total) by mouth daily.   escitalopram (LEXAPRO) 20 MG tablet Take 1 tablet (20 mg total) by mouth daily.   hydrochlorothiazide (HYDRODIURIL) 12.5 MG tablet Take 1 tablet (12.5 mg total) by mouth daily.   No facility-administered encounter medications on file as of 05/25/2021.    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 Interim medical history since our last visit reviewed.  Review of Systems Per HPI unless specifically indicated above     Objective:    BP (!) 154/88    Pulse 76    Ht 5\' 3"  (1.6 m)    Wt 174 lb (78.9 kg)    LMP 01/22/2011    SpO2 98%    BMI 30.82 kg/m   Wt Readings from Last 3 Encounters:  05/25/21 174 lb (78.9 kg)  02/09/21 170 lb (77.1 kg)  12/29/20 174 lb 12.8 oz (79.3 kg)    Physical Exam Vitals and nursing note reviewed.  Constitutional:      General: She is not in acute distress.    Appearance: Normal appearance. She is not toxic-appearing.  HENT:     Head: Normocephalic and atraumatic.  Eyes:     General: No scleral icterus.    Extraocular Movements: Extraocular movements intact.  Cardiovascular:     Rate and Rhythm: Normal rate and regular rhythm.     Heart sounds: Normal heart sounds. No murmur heard. Pulmonary:  Effort: Pulmonary effort is normal. No respiratory distress.     Breath sounds: Normal breath sounds. No wheezing, rhonchi or rales.  Musculoskeletal:     Right lower leg: No edema.     Left lower leg: No edema.  Skin:    General: Skin is warm and dry.     Coloration: Skin is not jaundiced or pale.     Findings: No erythema.  Neurological:     Mental Status: She is alert and oriented to person, place, and time.       Assessment & Plan:   Problem List Items Addressed This Visit       Cardiovascular and Mediastinum   Primary hypertension    Chronic.  BP elevated today in office, however reports good control at home.  Plan to  continue current medications for now including amlodipine 5 mg daily and HCTZ 12.5 mg daily.  Patient asks me at the end of the visit to fill out FMLA paperwork for her for high blood pressure.  She reports if she eats something she is not supposed to eat, her blood pressure spikes at work and this makes her feel bad so she gets sent home.  She does not want to get into disciplinary action at work for having to go home.  We discussed dietary and lifestyle changes and foods to avoid that spike her blood pressure. I informed her that I am not comfortable writing disability paperwork for her to be planned out of work for uncontrolled blood pressure when we know what the cause is.  She verbalized understanding.      Relevant Medications   hydrochlorothiazide (HYDRODIURIL) 12.5 MG tablet   amLODipine (NORVASC) 5 MG tablet   Hot flashes    Chronic.  Stable with Lexapro 20 mg daily.  Continue this medication.  Refills given.      Relevant Medications   escitalopram (LEXAPRO) 20 MG tablet   hydrochlorothiazide (HYDRODIURIL) 12.5 MG tablet   amLODipine (NORVASC) 5 MG tablet     Follow up plan: Return for with new PCP.

## 2021-10-29 ENCOUNTER — Other Ambulatory Visit (HOSPITAL_COMMUNITY): Payer: Self-pay | Admitting: Internal Medicine

## 2021-10-29 DIAGNOSIS — Z1231 Encounter for screening mammogram for malignant neoplasm of breast: Secondary | ICD-10-CM

## 2021-12-31 ENCOUNTER — Ambulatory Visit (HOSPITAL_COMMUNITY)
Admission: RE | Admit: 2021-12-31 | Discharge: 2021-12-31 | Disposition: A | Discharge status: Home / Self Care | Payer: 59 | Source: Ambulatory Visit | Attending: Internal Medicine | Admitting: Internal Medicine

## 2021-12-31 DIAGNOSIS — Z1231 Encounter for screening mammogram for malignant neoplasm of breast: Secondary | ICD-10-CM | POA: Insufficient documentation

## 2022-07-10 DIAGNOSIS — R232 Flushing: Secondary | ICD-10-CM | POA: Diagnosis not present

## 2022-07-10 DIAGNOSIS — R7303 Prediabetes: Secondary | ICD-10-CM | POA: Diagnosis not present

## 2022-07-10 DIAGNOSIS — E7849 Other hyperlipidemia: Secondary | ICD-10-CM | POA: Diagnosis not present

## 2022-07-10 DIAGNOSIS — I10 Essential (primary) hypertension: Secondary | ICD-10-CM | POA: Diagnosis not present

## 2022-11-07 DIAGNOSIS — I10 Essential (primary) hypertension: Secondary | ICD-10-CM | POA: Diagnosis not present

## 2022-11-07 DIAGNOSIS — K7400 Hepatic fibrosis, unspecified: Secondary | ICD-10-CM | POA: Diagnosis not present

## 2022-11-07 DIAGNOSIS — Z124 Encounter for screening for malignant neoplasm of cervix: Secondary | ICD-10-CM | POA: Diagnosis not present

## 2022-11-07 DIAGNOSIS — R7303 Prediabetes: Secondary | ICD-10-CM | POA: Diagnosis not present

## 2022-11-07 DIAGNOSIS — Z0000 Encounter for general adult medical examination without abnormal findings: Secondary | ICD-10-CM | POA: Diagnosis not present

## 2022-11-07 DIAGNOSIS — E7849 Other hyperlipidemia: Secondary | ICD-10-CM | POA: Diagnosis not present

## 2022-11-07 DIAGNOSIS — Z8619 Personal history of other infectious and parasitic diseases: Secondary | ICD-10-CM | POA: Diagnosis not present

## 2022-11-12 ENCOUNTER — Encounter (INDEPENDENT_AMBULATORY_CARE_PROVIDER_SITE_OTHER): Payer: Self-pay | Admitting: *Deleted

## 2022-12-13 ENCOUNTER — Encounter (INDEPENDENT_AMBULATORY_CARE_PROVIDER_SITE_OTHER): Payer: Self-pay | Admitting: *Deleted

## 2022-12-13 ENCOUNTER — Telehealth (INDEPENDENT_AMBULATORY_CARE_PROVIDER_SITE_OTHER): Payer: Self-pay | Admitting: *Deleted

## 2022-12-13 NOTE — Telephone Encounter (Signed)
Referring MD/PCP: [facility]  Insurance: [employer] [id number]  Best Phone Number: [phone]  Reason for the colonoscopy screening  Has patient had this procedure before?  no  If so, when, by whom and where?    Is there a family history of colon cancer?  no  Who?  What age when diagnosed?    Is patient diabetic? If yes, Type 1 or Type 2   no      Does patient have prosthetic heart valve or mechanical valve?  no  Do you have a pacemaker/defibrillator?  no  Has patient ever had endocarditis/atrial fibrillation? no  Has patient had joint replacement within last 12 months?  no  Is patient constipated or do they take laxatives? no  Does patient have a history of alcohol/drug use?  no  Does patient use oxygen? no  Have you had a stroke/heart attack last 6 mths? no  Do you take medicine for weight loss?  no  For female patients,: have you had a hysterectomy no                      are you post menopausal yes                      do you still have your menstrual cycle no  Do you take any blood-thinning medications such as: (aspirin, warfarin, Plavix, Aggrenox)  no  If yes we need the name, milligram, dosage and who is prescribing doctor   Medications: atorvastatin 20 mg daily, amlodipine 10 mg daily, escitalopram 10 mg daily,  Allergies: NKDA  Pharmacy: [facility]

## 2022-12-13 NOTE — Telephone Encounter (Signed)
Room 1 Thanks 

## 2022-12-16 ENCOUNTER — Other Ambulatory Visit (HOSPITAL_COMMUNITY): Payer: Self-pay | Admitting: Internal Medicine

## 2022-12-16 DIAGNOSIS — Z1231 Encounter for screening mammogram for malignant neoplasm of breast: Secondary | ICD-10-CM

## 2022-12-16 NOTE — Telephone Encounter (Signed)
Left message to return call 

## 2022-12-17 NOTE — Telephone Encounter (Signed)
Left message to return call 

## 2022-12-18 NOTE — Telephone Encounter (Signed)
Pt left message returning call. Attempted to reach pt today but had to leave voice mail to return call

## 2022-12-27 ENCOUNTER — Encounter (INDEPENDENT_AMBULATORY_CARE_PROVIDER_SITE_OTHER): Payer: Self-pay

## 2022-12-27 NOTE — Telephone Encounter (Signed)
Letter mailed to patient.

## 2023-01-02 ENCOUNTER — Ambulatory Visit (HOSPITAL_COMMUNITY): Payer: 59

## 2023-01-06 ENCOUNTER — Inpatient Hospital Stay (HOSPITAL_COMMUNITY): Admission: RE | Admit: 2023-01-06 | Payer: 59 | Source: Ambulatory Visit

## 2023-01-07 NOTE — Telephone Encounter (Signed)
Pt left voicemail yesterday to be scheduled.  Attempted to reach pt this morning but phone rings one time and goes straight to voicemail. Left message to return call.

## 2023-01-13 ENCOUNTER — Ambulatory Visit (HOSPITAL_COMMUNITY)
Admission: RE | Admit: 2023-01-13 | Discharge: 2023-01-13 | Disposition: A | Discharge status: Home / Self Care | Payer: BC Managed Care – PPO | Source: Ambulatory Visit | Attending: Internal Medicine | Admitting: Internal Medicine

## 2023-01-13 ENCOUNTER — Encounter (HOSPITAL_COMMUNITY): Payer: Self-pay

## 2023-01-13 DIAGNOSIS — Z1231 Encounter for screening mammogram for malignant neoplasm of breast: Secondary | ICD-10-CM | POA: Diagnosis not present

## 2023-01-31 ENCOUNTER — Encounter: Payer: Self-pay | Admitting: *Deleted

## 2023-01-31 ENCOUNTER — Other Ambulatory Visit: Payer: Self-pay | Admitting: *Deleted

## 2023-01-31 DIAGNOSIS — Z1211 Encounter for screening for malignant neoplasm of colon: Secondary | ICD-10-CM

## 2023-01-31 NOTE — Telephone Encounter (Signed)
Pt has been scheduled for Friday 02/21/23. Instructions printed and placed up front with a sample of clenpiq.

## 2023-02-12 ENCOUNTER — Encounter (INDEPENDENT_AMBULATORY_CARE_PROVIDER_SITE_OTHER): Payer: Self-pay | Admitting: *Deleted

## 2023-02-12 NOTE — Telephone Encounter (Signed)
Referral completed, [facility] apt letter sent to PCP

## 2023-02-18 ENCOUNTER — Other Ambulatory Visit (HOSPITAL_COMMUNITY)
Admission: RE | Admit: 2023-02-18 | Discharge: 2023-02-18 | Disposition: A | Discharge status: Home / Self Care | Payer: BC Managed Care – PPO | Source: Ambulatory Visit | Attending: Gastroenterology | Admitting: Gastroenterology

## 2023-02-18 DIAGNOSIS — I10 Essential (primary) hypertension: Secondary | ICD-10-CM | POA: Diagnosis not present

## 2023-02-18 DIAGNOSIS — Z1211 Encounter for screening for malignant neoplasm of colon: Secondary | ICD-10-CM | POA: Insufficient documentation

## 2023-02-18 DIAGNOSIS — K5730 Diverticulosis of large intestine without perforation or abscess without bleeding: Secondary | ICD-10-CM | POA: Diagnosis not present

## 2023-02-18 DIAGNOSIS — K648 Other hemorrhoids: Secondary | ICD-10-CM | POA: Diagnosis not present

## 2023-02-18 DIAGNOSIS — D120 Benign neoplasm of cecum: Secondary | ICD-10-CM | POA: Diagnosis not present

## 2023-02-18 LAB — BASIC METABOLIC PANEL
Anion gap: 7 (ref 5–15)
BUN: 23 mg/dL — ABNORMAL HIGH (ref 6–20)
CO2: 28 mmol/L (ref 22–32)
Calcium: 9 mg/dL (ref 8.9–10.3)
Chloride: 104 mmol/L (ref 98–111)
Creatinine, Ser: 1.12 mg/dL — ABNORMAL HIGH (ref 0.44–1.00)
GFR, Estimated: 57 mL/min — ABNORMAL LOW (ref >60)
Glucose, Bld: 97 mg/dL (ref 70–99)
Potassium: 3.8 mmol/L (ref 3.5–5.1)
Sodium: 139 mmol/L (ref 135–145)

## 2023-02-21 ENCOUNTER — Ambulatory Visit (HOSPITAL_COMMUNITY): Payer: BC Managed Care – PPO | Admitting: Certified Registered"

## 2023-02-21 ENCOUNTER — Encounter (HOSPITAL_COMMUNITY)
Admission: RE | Disposition: A | Discharge status: Home / Self Care | Payer: Self-pay | Source: Home / Self Care | Attending: Gastroenterology

## 2023-02-21 ENCOUNTER — Ambulatory Visit (HOSPITAL_COMMUNITY)
Admission: RE | Admit: 2023-02-21 | Discharge: 2023-02-21 | Disposition: A | Discharge status: Home / Self Care | Payer: BC Managed Care – PPO | Attending: Gastroenterology | Admitting: Gastroenterology

## 2023-02-21 ENCOUNTER — Encounter (HOSPITAL_COMMUNITY): Payer: Self-pay | Admitting: Gastroenterology

## 2023-02-21 ENCOUNTER — Other Ambulatory Visit: Payer: Self-pay

## 2023-02-21 DIAGNOSIS — K648 Other hemorrhoids: Secondary | ICD-10-CM | POA: Insufficient documentation

## 2023-02-21 DIAGNOSIS — I10 Essential (primary) hypertension: Secondary | ICD-10-CM | POA: Diagnosis not present

## 2023-02-21 DIAGNOSIS — D120 Benign neoplasm of cecum: Secondary | ICD-10-CM | POA: Diagnosis not present

## 2023-02-21 DIAGNOSIS — K6389 Other specified diseases of intestine: Secondary | ICD-10-CM | POA: Diagnosis not present

## 2023-02-21 DIAGNOSIS — K5730 Diverticulosis of large intestine without perforation or abscess without bleeding: Secondary | ICD-10-CM | POA: Insufficient documentation

## 2023-02-21 DIAGNOSIS — Z1211 Encounter for screening for malignant neoplasm of colon: Secondary | ICD-10-CM | POA: Diagnosis not present

## 2023-02-21 DIAGNOSIS — K635 Polyp of colon: Secondary | ICD-10-CM | POA: Diagnosis not present

## 2023-02-21 DIAGNOSIS — Z139 Encounter for screening, unspecified: Secondary | ICD-10-CM | POA: Diagnosis not present

## 2023-02-21 HISTORY — PX: POLYPECTOMY: SHX5525

## 2023-02-21 HISTORY — PX: COLONOSCOPY WITH PROPOFOL: SHX5780

## 2023-02-21 LAB — HM COLONOSCOPY

## 2023-02-21 SURGERY — COLONOSCOPY WITH PROPOFOL
Anesthesia: General

## 2023-02-21 MED ORDER — LIDOCAINE HCL (CARDIAC) PF 100 MG/5ML IV SOSY
PREFILLED_SYRINGE | INTRAVENOUS | Status: DC | PRN
  Administered 2023-02-21: 80 mg via INTRAVENOUS

## 2023-02-21 MED ORDER — SODIUM CHLORIDE 0.9% FLUSH: 10.0000 mL | Freq: Two times a day (BID) | INTRAVENOUS | Status: DC

## 2023-02-21 MED ORDER — PROPOFOL 500 MG/50ML IV EMUL
INTRAVENOUS | Status: DC | PRN
  Administered 2023-02-21: 125 ug/kg/min via INTRAVENOUS

## 2023-02-21 MED ORDER — PROPOFOL 10 MG/ML IV BOLUS
INTRAVENOUS | Status: DC | PRN
  Administered 2023-02-21: 80 mg via INTRAVENOUS

## 2023-02-21 MED ORDER — PHENYLEPHRINE 80 MCG/ML (10ML) SYRINGE FOR IV PUSH (FOR BLOOD PRESSURE SUPPORT)
PREFILLED_SYRINGE | INTRAVENOUS | Status: DC | PRN
  Administered 2023-02-21: 80 ug via INTRAVENOUS
  Administered 2023-02-21: 160 ug via INTRAVENOUS

## 2023-02-21 MED ORDER — LACTATED RINGERS IV SOLN: INTRAVENOUS | Status: DC | PRN

## 2023-02-21 NOTE — Transfer of Care (Addendum)
Immediate Anesthesia Transfer of Care Note  Patient: [name]  Procedure(s) Performed: COLONOSCOPY WITH PROPOFOL POLYPECTOMY  Patient Location: PACU and Endoscopy Unit  Anesthesia Type:General  Level of Consciousness: drowsy and patient cooperative  Airway & Oxygen Therapy: Patient Spontanous Breathing and Patient connected to nasal cannula oxygen  Post-op Assessment: Report given to RN and Post -op Vital signs reviewed and stable  Post vital signs: Reviewed and stable  Last Vitals:  Vitals Value Taken Time  BP 135/60 02/21/23   0848  Temp 36.4 C 02/21/23 0848  Pulse 54 02/21/23 0848  Resp 16 02/21/23 0848  SpO2 99% 11/0/24    0848    Last Pain:  Vitals:   02/21/23 0848  TempSrc: Oral  PainSc: 0-No pain      Patients Stated Pain Goal: 4 (02/21/23 0707)  Complications: No notable events documented.

## 2023-02-21 NOTE — Progress Notes (Signed)
[name] had a medical procedure on 02/21/23 requiring anesthesia and also pre procedure prep on 02/20/23.  Please excuse her from work on these two days.  Thank you Virgie Dad RN [facility]

## 2023-02-21 NOTE — Discharge Instructions (Addendum)
You are being discharged to home.  Resume your previous diet.  We are waiting for your pathology results.  Your physician has recommended a repeat colonoscopy (date to be determined after pending pathology results are reviewed) for screening purposes.  

## 2023-02-21 NOTE — Op Note (Signed)
[facility] Patient Name: [name] Procedure Date: 02/21/2023 8:08 AM MRN: [id number] Date of Birth: 02/21/1966 Attending MD: Katrinka Blazing , , [id number] CSN: [id number] Age: 57 Admit Type: Outpatient Procedure:                Colonoscopy Indications:              Screening for colorectal malignant neoplasm Providers:                Katrinka Blazing, Francoise Ceo RN, RN, Angelica Ran,                            Kristine L. Jessee Avers, Technician Referring MD:              Medicines:                Monitored Anesthesia Care Complications:            No immediate complications. Estimated Blood Loss:     Estimated blood loss: none. Procedure:                Pre-Anesthesia Assessment:                           - Prior to the procedure, a History and Physical                            was performed, and patient medications, allergies                            and sensitivities were reviewed. The patient's                            tolerance of previous anesthesia was reviewed.                           - The risks and benefits of the procedure and the                            sedation options and risks were discussed with the                            patient. All questions were answered and informed                            consent was obtained.                           - ASA Grade Assessment: II - A patient with mild                            systemic disease.                           After obtaining informed consent, the colonoscope                            was passed under direct vision.  Throughout the                            procedure, the patient's blood pressure, pulse, and                            oxygen saturations were monitored continuously. The                            [id number] ([id number]) was introduced through the                            anus and advanced to the the cecum, identified by                            appendiceal orifice and  ileocecal valve. The                            colonoscopy was performed without difficulty. The                            patient tolerated the procedure well. The quality                            of the bowel preparation was excellent. Scope In: 8:26:56 AM Scope Out: 8:43:12 AM Scope Withdrawal Time: 0 hours 13 minutes 15 seconds  Total Procedure Duration: 0 hours 16 minutes 16 seconds  Findings:      The perianal and digital rectal examinations were normal.      A 2 mm polyp was found in the cecum. The polyp was sessile. The polyp       was removed with a cold biopsy forceps. Resection and retrieval were       complete.      A few small-mouthed diverticula were found in the sigmoid colon.      Non-bleeding internal hemorrhoids were found during retroflexion. The       hemorrhoids were small. Impression:               - One 2 mm polyp in the cecum, removed with a cold                            biopsy forceps. Resected and retrieved.                           - Diverticulosis in the sigmoid colon.                           - Non-bleeding internal hemorrhoids. Moderate Sedation:      Per Anesthesia Care Recommendation:           - Discharge patient to home (ambulatory).                           - Resume previous diet.                           -  Await pathology results.                           - Repeat colonoscopy date to be determined after                            pending pathology results are reviewed for                            screening purposes. Procedure Code(s):        --- Professional ---                           [phone], Colonoscopy, flexible; with biopsy, single                            or multiple Diagnosis Code(s):        --- Professional ---                           Z12.11, Encounter for screening for malignant                            neoplasm of colon                           D12.0, Benign neoplasm of cecum                           K64.8, Other  hemorrhoids                           K57.30, Diverticulosis of large intestine without                            perforation or abscess without bleeding CPT copyright 2022 American Medical Association. All rights reserved. The codes documented in this report are preliminary and upon coder review may  be revised to meet current compliance requirements. Katrinka Blazing, MD Katrinka Blazing,  02/21/2023 8:47:17 AM This report has been signed electronically. Number of Addenda: 0

## 2023-02-21 NOTE — Anesthesia Preprocedure Evaluation (Signed)
Anesthesia Evaluation  Patient identified by MRN, date of birth, ID band Patient awake    Reviewed: Allergy & Precautions, H&P , NPO status , Patient's Chart, lab work & pertinent test results, reviewed documented beta blocker date and time   Airway Mallampati: II  TM Distance: >3 FB Neck ROM: full    Dental no notable dental hx.    Pulmonary neg pulmonary ROS   Pulmonary exam normal breath sounds clear to auscultation       Cardiovascular Exercise Tolerance: Good hypertension, negative cardio ROS  Rhythm:regular Rate:Normal     Neuro/Psych negative neurological ROS  negative psych ROS   GI/Hepatic negative GI ROS, Neg liver ROS,,,(+) Hepatitis -  Endo/Other  negative endocrine ROS    Renal/GU negative Renal ROS  negative genitourinary   Musculoskeletal   Abdominal   Peds  Hematology negative hematology ROS (+)   Anesthesia Other Findings   Reproductive/Obstetrics negative OB ROS                             Anesthesia Physical Anesthesia Plan  ASA: 2  Anesthesia Plan: General   Post-op Pain Management:    Induction:   PONV Risk Score and Plan: Propofol infusion  Airway Management Planned:   Additional Equipment:   Intra-op Plan:   Post-operative Plan:   Informed Consent: I have reviewed the patients History and Physical, chart, labs and discussed the procedure including the risks, benefits and alternatives for the proposed anesthesia with the patient or authorized representative who has indicated his/her understanding and acceptance.     Dental Advisory Given  Plan Discussed with: CRNA  Anesthesia Plan Comments:        Anesthesia Quick Evaluation

## 2023-02-21 NOTE — H&P (Signed)
[name] is an 57 y.o. female.   Chief Complaint: screening colonoscopy HPI: 57 y/o F with PMH HTN, coming for screening colonoscopy. The patient has never had a colonoscopy in the past.  The patient denies having any complaints such as melena, hematochezia, abdominal pain or distention, change in her bowel movement consistency or frequency, no changes in weight recently.  No family history of colorectal cancer.   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 She has never used smokeless tobacco. She reports current alcohol use of about 4.0 standard drinks of alcohol per week. She reports that she does not use drugs.  Allergies: No Known Allergies  Medications Prior to Admission  Medication Sig Dispense Refill   amLODipine (NORVASC) 5 MG tablet Take 1 tablet (5 mg total) by mouth daily. 90 tablet 1   escitalopram (LEXAPRO) 20 MG tablet Take 1 tablet (20 mg total) by mouth daily. 90 tablet 1   hydrochlorothiazide (HYDRODIURIL) 12.5 MG tablet Take 1 tablet (12.5 mg total) by mouth daily. 90 tablet 1   valACYclovir (VALTREX) 500 MG tablet Take 1 tablet (500 mg total) by mouth daily. 90 tablet 2    No results found for this or any previous visit (from the past 48 hour(s)). No results found.  Review of Systems  All other systems reviewed  and are negative.   Blood pressure 131/76, pulse 64, temperature 98.5 F (36.9 C), temperature source Oral, resp. rate 16, height 5\' 3"  (1.6 m), weight 77.1 kg, last menstrual period 01/22/2011, SpO2 99%. Physical Exam  GENERAL: The patient is AO x3, in no acute distress. HEENT: Head is normocephalic and atraumatic. EOMI are intact. Mouth is well hydrated and without lesions. NECK: Supple. No masses LUNGS: Clear to auscultation. No presence of rhonchi/wheezing/rales. Adequate chest expansion HEART: RRR, normal s1 and s2. ABDOMEN: Soft, nontender, no guarding, no peritoneal signs, and nondistended. BS +. No masses. EXTREMITIES: Without any cyanosis, clubbing, rash, lesions or edema. NEUROLOGIC: AOx3, no focal motor deficit. SKIN: no jaundice, no rashes  Assessment/Plan 57 y/o F with PMH HTN, coming for screening colonoscopy. The patient is at average risk for colorectal cancer.  We will proceed with colonoscopy today.  Dolores Frame, MD 02/21/2023, 8:07 AM

## 2023-02-24 LAB — SURGICAL PATHOLOGY

## 2023-02-25 ENCOUNTER — Encounter (INDEPENDENT_AMBULATORY_CARE_PROVIDER_SITE_OTHER): Payer: Self-pay | Admitting: *Deleted

## 2023-02-27 ENCOUNTER — Encounter (INDEPENDENT_AMBULATORY_CARE_PROVIDER_SITE_OTHER): Payer: Self-pay | Admitting: *Deleted

## 2023-02-27 ENCOUNTER — Encounter (HOSPITAL_COMMUNITY): Payer: Self-pay | Admitting: Gastroenterology

## 2023-02-28 NOTE — Anesthesia Postprocedure Evaluation (Signed)
Anesthesia Post Note  Patient: [name]  Procedure(s) Performed: COLONOSCOPY WITH PROPOFOL POLYPECTOMY  Patient location during evaluation: Phase II Anesthesia Type: General Level of consciousness: awake Pain management: pain level controlled Vital Signs Assessment: post-procedure vital signs reviewed and stable Respiratory status: spontaneous breathing and respiratory function stable Cardiovascular status: blood pressure returned to baseline and stable Postop Assessment: no headache and no apparent nausea or vomiting Anesthetic complications: no Comments: Late entry   No notable events documented.   Last Vitals:  Vitals:   02/21/23 0707 02/21/23 0848  BP: 131/76 135/60  Pulse: 64 (!) 54  Resp: 16 16  Temp: 36.9 C 36.4 C  SpO2: 99%     Last Pain:  Vitals:   02/21/23 0848  TempSrc: Oral  PainSc: 0-No pain                 Windell Norfolk

## 2023-03-03 ENCOUNTER — Telehealth (INDEPENDENT_AMBULATORY_CARE_PROVIDER_SITE_OTHER): Payer: Self-pay

## 2023-03-03 NOTE — Telephone Encounter (Signed)
I tried calling patient back, I left a message on her vm to please call the office back.   Patient calling for results of path report from [facility] on 02/21/2023   I reviewed the pathology results. Ann, can you send her a letter with the findings as described below please? Repeat colonoscopy in 10 years   Thanks,   Katrinka Blazing, MD Gastroenterology and Hepatology [facility]   ---------------------------------------------------------------------------------------------   [facility] [address], [place], [place] [address] Phone:  [phone]     02/24/23 [place], [place]                                                                          Dear [name],   I am writing to inform you that the biopsies taken during your recent endoscopic examination showed:   A. CECUM, POLYPECTOMY: Benign colonic mucosa with prominent lymphoid aggregate Negative for dysplasia and carcinoma     What does this mean? The pathology showed the removed tissue was completely benign and mostly consisted of normal lymphoid tissue. No precancerous changes were noted.  Due to this, I will recommend a repeat colonoscopy in 10 years.    Please call us at [phone] if you have persistent problems or have questions about your condition that have not been fully answered at this time.   Sincerely,   Katrinka Blazing, MD Gastroenterology and Hepatology

## 2023-03-04 NOTE — Telephone Encounter (Signed)
Left message asked that the patient please return call.  ?

## 2023-03-05 NOTE — Telephone Encounter (Signed)
I called and left a detailed message that if she still needed our assistance to please return the call to the office. She was made aware that the results were mailed to her and if she had any question regarding the letter to please call the office.

## 2023-03-10 ENCOUNTER — Telehealth (INDEPENDENT_AMBULATORY_CARE_PROVIDER_SITE_OTHER): Payer: Self-pay | Admitting: Gastroenterology

## 2023-03-10 NOTE — Telephone Encounter (Signed)
Pt left voicemail in regards to rescheduling colonoscopy. Pt had [facility] on 02/21/23 and lab report stated it would be repeated in 10 years.  Returned call to pt but had to leave voicemail letting her know. Left main number in case she had any questions.

## 2023-03-12 NOTE — Telephone Encounter (Signed)
Pt left voicemail to reschedule colonoscopy.  Returned call to pt and had to leave message. Left detailed message to let her know that she does not need another TCS for 10 years.

## 2023-03-17 NOTE — Telephone Encounter (Signed)
Pt left voicemail in regards to getting in touch with [place] to reschedule colonoscopy. Attempted to call pt back but phone rings once and then goes to voicemail. Will send letter out to pt.

## 2023-06-26 DIAGNOSIS — R7303 Prediabetes: Secondary | ICD-10-CM | POA: Diagnosis not present

## 2023-06-26 DIAGNOSIS — E7849 Other hyperlipidemia: Secondary | ICD-10-CM | POA: Diagnosis not present

## 2023-06-26 DIAGNOSIS — Z23 Encounter for immunization: Secondary | ICD-10-CM | POA: Diagnosis not present

## 2023-06-26 DIAGNOSIS — I10 Essential (primary) hypertension: Secondary | ICD-10-CM | POA: Diagnosis not present

## 2023-06-26 DIAGNOSIS — R232 Flushing: Secondary | ICD-10-CM | POA: Diagnosis not present

## 2023-06-26 DIAGNOSIS — K7400 Hepatic fibrosis, unspecified: Secondary | ICD-10-CM | POA: Diagnosis not present

## 2024-02-02 DIAGNOSIS — Z8619 Personal history of other infectious and parasitic diseases: Secondary | ICD-10-CM | POA: Diagnosis not present

## 2024-02-02 DIAGNOSIS — E7849 Other hyperlipidemia: Secondary | ICD-10-CM | POA: Diagnosis not present

## 2024-02-02 DIAGNOSIS — I10 Essential (primary) hypertension: Secondary | ICD-10-CM | POA: Diagnosis not present

## 2024-02-02 DIAGNOSIS — K7400 Hepatic fibrosis, unspecified: Secondary | ICD-10-CM | POA: Diagnosis not present

## 2024-02-02 DIAGNOSIS — R232 Flushing: Secondary | ICD-10-CM | POA: Diagnosis not present

## 2024-02-02 DIAGNOSIS — R7303 Prediabetes: Secondary | ICD-10-CM | POA: Diagnosis not present

## 2024-02-04 ENCOUNTER — Encounter (INDEPENDENT_AMBULATORY_CARE_PROVIDER_SITE_OTHER): Payer: Self-pay | Admitting: Gastroenterology

## 2024-02-25 ENCOUNTER — Other Ambulatory Visit (HOSPITAL_COMMUNITY): Payer: Self-pay | Admitting: Internal Medicine

## 2024-02-25 DIAGNOSIS — Z1231 Encounter for screening mammogram for malignant neoplasm of breast: Secondary | ICD-10-CM

## 2024-03-08 ENCOUNTER — Ambulatory Visit (HOSPITAL_COMMUNITY)

## 2024-04-26 ENCOUNTER — Other Ambulatory Visit (HOSPITAL_COMMUNITY): Payer: Self-pay | Admitting: Internal Medicine

## 2024-04-26 DIAGNOSIS — Z1231 Encounter for screening mammogram for malignant neoplasm of breast: Secondary | ICD-10-CM

## 2024-05-10 ENCOUNTER — Ambulatory Visit (HOSPITAL_COMMUNITY)
Admission: RE | Admit: 2024-05-10 | Discharge: 2024-05-10 | Disposition: A | Source: Ambulatory Visit | Attending: Internal Medicine

## 2024-05-10 DIAGNOSIS — Z1231 Encounter for screening mammogram for malignant neoplasm of breast: Secondary | ICD-10-CM | POA: Insufficient documentation
